# Patient Record
Sex: Female | Born: 1985 | Race: White | Hispanic: No | Marital: Single | Smoking: Current every day smoker
Health system: Southern US, Community
[De-identification: ages and names within clinical notes are randomized; demographics above are authoritative.]

## PROBLEM LIST (undated history)

## (undated) DIAGNOSIS — G40909 Epilepsy, unspecified, not intractable, without status epilepticus: Secondary | ICD-10-CM

## (undated) HISTORY — PX: APPENDECTOMY: SHX54

## (undated) HISTORY — PX: TUBAL LIGATION: SHX77

## (undated) HISTORY — PX: WISDOM TOOTH EXTRACTION: SHX21

---
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
## 2007-01-28 ENCOUNTER — Emergency Department: Payer: Self-pay | Admitting: Emergency Medicine

## 2007-07-21 ENCOUNTER — Emergency Department: Payer: Self-pay | Admitting: Emergency Medicine

## 2009-08-20 ENCOUNTER — Emergency Department: Payer: Self-pay | Admitting: Unknown Physician Specialty

## 2009-09-24 ENCOUNTER — Encounter: Payer: Self-pay | Admitting: Obstetrics & Gynecology

## 2010-02-08 ENCOUNTER — Observation Stay: Payer: Self-pay

## 2010-02-25 ENCOUNTER — Observation Stay: Payer: Self-pay

## 2010-03-10 ENCOUNTER — Inpatient Hospital Stay: Payer: Self-pay

## 2010-03-18 LAB — PATHOLOGY REPORT

## 2010-09-28 ENCOUNTER — Ambulatory Visit: Payer: Self-pay | Admitting: Internal Medicine

## 2011-01-11 ENCOUNTER — Inpatient Hospital Stay: Payer: Self-pay | Admitting: Obstetrics & Gynecology

## 2011-05-24 ENCOUNTER — Observation Stay: Payer: Self-pay | Admitting: Obstetrics and Gynecology

## 2011-07-13 ENCOUNTER — Observation Stay: Payer: Self-pay | Admitting: Obstetrics & Gynecology

## 2011-07-13 LAB — PIH PROFILE
Anion Gap: 13 (ref 7–16)
Calcium, Total: 8.1 mg/dL — ABNORMAL LOW (ref 8.5–10.1)
Chloride: 107 mmol/L (ref 98–107)
Co2: 21 mmol/L (ref 21–32)
Creatinine: 0.5 mg/dL — ABNORMAL LOW (ref 0.60–1.30)
EGFR (African American): >60
EGFR (Non-African Amer.): >60
Glucose: 84 mg/dL (ref 65–99)
HCT: 35.2 % (ref 35.0–47.0)
HGB: 11.5 g/dL — ABNORMAL LOW (ref 12.0–16.0)
MCH: 27.9 pg (ref 26.0–34.0)
MCV: 86 fL (ref 80–100)
Platelet: 227 10*3/uL (ref 150–440)
RBC: 4.11 10*6/uL (ref 3.80–5.20)
RDW: 14.6 % — ABNORMAL HIGH (ref 11.5–14.5)
SGOT(AST): 19 U/L (ref 15–37)
Uric Acid: 4.5 mg/dL (ref 2.6–6.0)
WBC: 11.3 10*3/uL — ABNORMAL HIGH (ref 3.6–11.0)

## 2011-07-13 LAB — PROTEIN / CREATININE RATIO, URINE
Creatinine, Urine: 17.6 mg/dL — ABNORMAL LOW (ref 30.0–125.0)
Protein, Random Urine: <5 mg/dL — ABNORMAL LOW (ref 0–12)

## 2011-07-15 ENCOUNTER — Inpatient Hospital Stay: Payer: Self-pay | Admitting: Obstetrics & Gynecology

## 2011-07-15 LAB — CBC WITH DIFFERENTIAL/PLATELET
Eosinophil #: 0 10*3/uL (ref 0.0–0.7)
Eosinophil %: 0.2 %
HGB: 11.6 g/dL — ABNORMAL LOW (ref 12.0–16.0)
Lymphocyte #: 3.3 10*3/uL (ref 1.0–3.6)
Lymphocyte %: 29.3 %
MCH: 28.5 pg (ref 26.0–34.0)
MCHC: 33.3 g/dL (ref 32.0–36.0)
MCV: 86 fL (ref 80–100)
Neutrophil #: 7.2 10*3/uL — ABNORMAL HIGH (ref 1.4–6.5)
Neutrophil %: 64.1 %
Platelet: 237 10*3/uL (ref 150–440)
RBC: 4.06 10*6/uL (ref 3.80–5.20)
RDW: 14.4 % (ref 11.5–14.5)
WBC: 11.2 10*3/uL — ABNORMAL HIGH (ref 3.6–11.0)

## 2011-07-15 LAB — PROTEIN / CREATININE RATIO, URINE
Creatinine, Urine: 184.3 mg/dL — ABNORMAL HIGH (ref 30.0–125.0)
Protein, Random Urine: 58 mg/dL — ABNORMAL HIGH (ref 0–12)
Protein/Creat. Ratio: 315 mg/gCREAT — ABNORMAL HIGH (ref 0–200)

## 2011-07-17 LAB — BETA STREP CULTURE(ARMC)

## 2011-08-25 ENCOUNTER — Ambulatory Visit: Payer: Self-pay | Admitting: Obstetrics and Gynecology

## 2011-08-25 LAB — CBC
HGB: 12.7 g/dL (ref 12.0–16.0)
MCHC: 33.3 g/dL (ref 32.0–36.0)
MCV: 86 fL (ref 80–100)
RBC: 4.43 10*6/uL (ref 3.80–5.20)
RDW: 14.3 % (ref 11.5–14.5)
WBC: 7.1 10*3/uL (ref 3.6–11.0)

## 2011-09-04 ENCOUNTER — Ambulatory Visit: Payer: Self-pay | Admitting: Obstetrics and Gynecology

## 2011-09-04 LAB — PREGNANCY, URINE: Pregnancy Test, Urine: NEGATIVE m[IU]/mL

## 2011-10-27 ENCOUNTER — Emergency Department: Payer: Self-pay | Admitting: Emergency Medicine

## 2012-02-24 ENCOUNTER — Inpatient Hospital Stay: Payer: Self-pay | Admitting: Internal Medicine

## 2012-02-24 LAB — CBC WITH DIFFERENTIAL/PLATELET
Basophil #: 0 10*3/uL (ref 0.0–0.1)
Basophil %: 0.3 %
Eosinophil #: 0.2 10*3/uL (ref 0.0–0.7)
Eosinophil %: 1.4 %
HGB: 12.8 g/dL (ref 12.0–16.0)
Lymphocyte #: 1.8 10*3/uL (ref 1.0–3.6)
Lymphocyte %: 11.7 %
MCH: 28.7 pg (ref 26.0–34.0)
MCHC: 33.8 g/dL (ref 32.0–36.0)
MCV: 85 fL (ref 80–100)
Monocyte #: 0.8 x10 3/mm (ref 0.2–0.9)
Monocyte %: 5.1 %
Neutrophil %: 81.5 %
Platelet: 273 10*3/uL (ref 150–440)
RBC: 4.45 10*6/uL (ref 3.80–5.20)

## 2012-02-24 LAB — COMPREHENSIVE METABOLIC PANEL
Albumin: 4 g/dL (ref 3.4–5.0)
Anion Gap: 8 (ref 7–16)
BUN: 5 mg/dL — ABNORMAL LOW (ref 7–18)
Calcium, Total: 8.5 mg/dL (ref 8.5–10.1)
Chloride: 104 mmol/L (ref 98–107)
EGFR (African American): >60
EGFR (Non-African Amer.): >60
Glucose: 90 mg/dL (ref 65–99)
Osmolality: 271 (ref 275–301)
Potassium: 3.1 mmol/L — ABNORMAL LOW (ref 3.5–5.1)
SGOT(AST): 33 U/L (ref 15–37)
SGPT (ALT): 32 U/L (ref 12–78)
Total Protein: 8.1 g/dL (ref 6.4–8.2)

## 2012-02-25 LAB — CBC WITH DIFFERENTIAL/PLATELET
Basophil %: 0.3 %
Eosinophil %: 1.5 %
HGB: 12.9 g/dL (ref 12.0–16.0)
Lymphocyte #: 2.3 10*3/uL (ref 1.0–3.6)
Lymphocyte %: 15.2 %
MCH: 27.8 pg (ref 26.0–34.0)
Monocyte #: 1 x10 3/mm — ABNORMAL HIGH (ref 0.2–0.9)
Neutrophil %: 76.3 %
RBC: 4.64 10*6/uL (ref 3.80–5.20)
RDW: 14.2 % (ref 11.5–14.5)

## 2012-02-25 LAB — VANCOMYCIN, TROUGH: Vancomycin, Trough: 10 ug/mL (ref 10–20)

## 2012-02-26 LAB — VANCOMYCIN, TROUGH: Vancomycin, Trough: 11 ug/mL (ref 10–20)

## 2012-02-26 LAB — CBC WITH DIFFERENTIAL/PLATELET
Basophil %: 0.4 %
Eosinophil %: 1.2 %
HCT: 38.7 % (ref 35.0–47.0)
Lymphocyte #: 1.9 10*3/uL (ref 1.0–3.6)
Lymphocyte %: 14.1 %
MCH: 27.3 pg (ref 26.0–34.0)
Monocyte #: 0.9 x10 3/mm (ref 0.2–0.9)
Monocyte %: 6.6 %
Neutrophil #: 10.7 10*3/uL — ABNORMAL HIGH (ref 1.4–6.5)
Neutrophil %: 77.7 %
RBC: 4.62 10*6/uL (ref 3.80–5.20)
WBC: 13.8 10*3/uL — ABNORMAL HIGH (ref 3.6–11.0)

## 2012-02-28 LAB — WOUND CULTURE

## 2012-02-29 LAB — CULTURE, BLOOD (SINGLE)

## 2012-10-18 ENCOUNTER — Emergency Department: Payer: Self-pay | Admitting: Unknown Physician Specialty

## 2013-02-03 ENCOUNTER — Emergency Department: Payer: Self-pay | Admitting: Emergency Medicine

## 2013-11-01 ENCOUNTER — Emergency Department: Payer: Self-pay | Admitting: Emergency Medicine

## 2013-12-30 IMAGING — CR RIGHT HAND - COMPLETE 3+ VIEW
1 series · 3 of 3 positions shown · non-contrast
Comparison: None.

CLINICAL DATA: Status post fall.  Pain about the right thumb.

EXAM:
RIGHT HAND - COMPLETE 3+ VIEW

[Series 1: pa · 0.17mm/px · 3 of 3 slices shown]
[im 1/3]
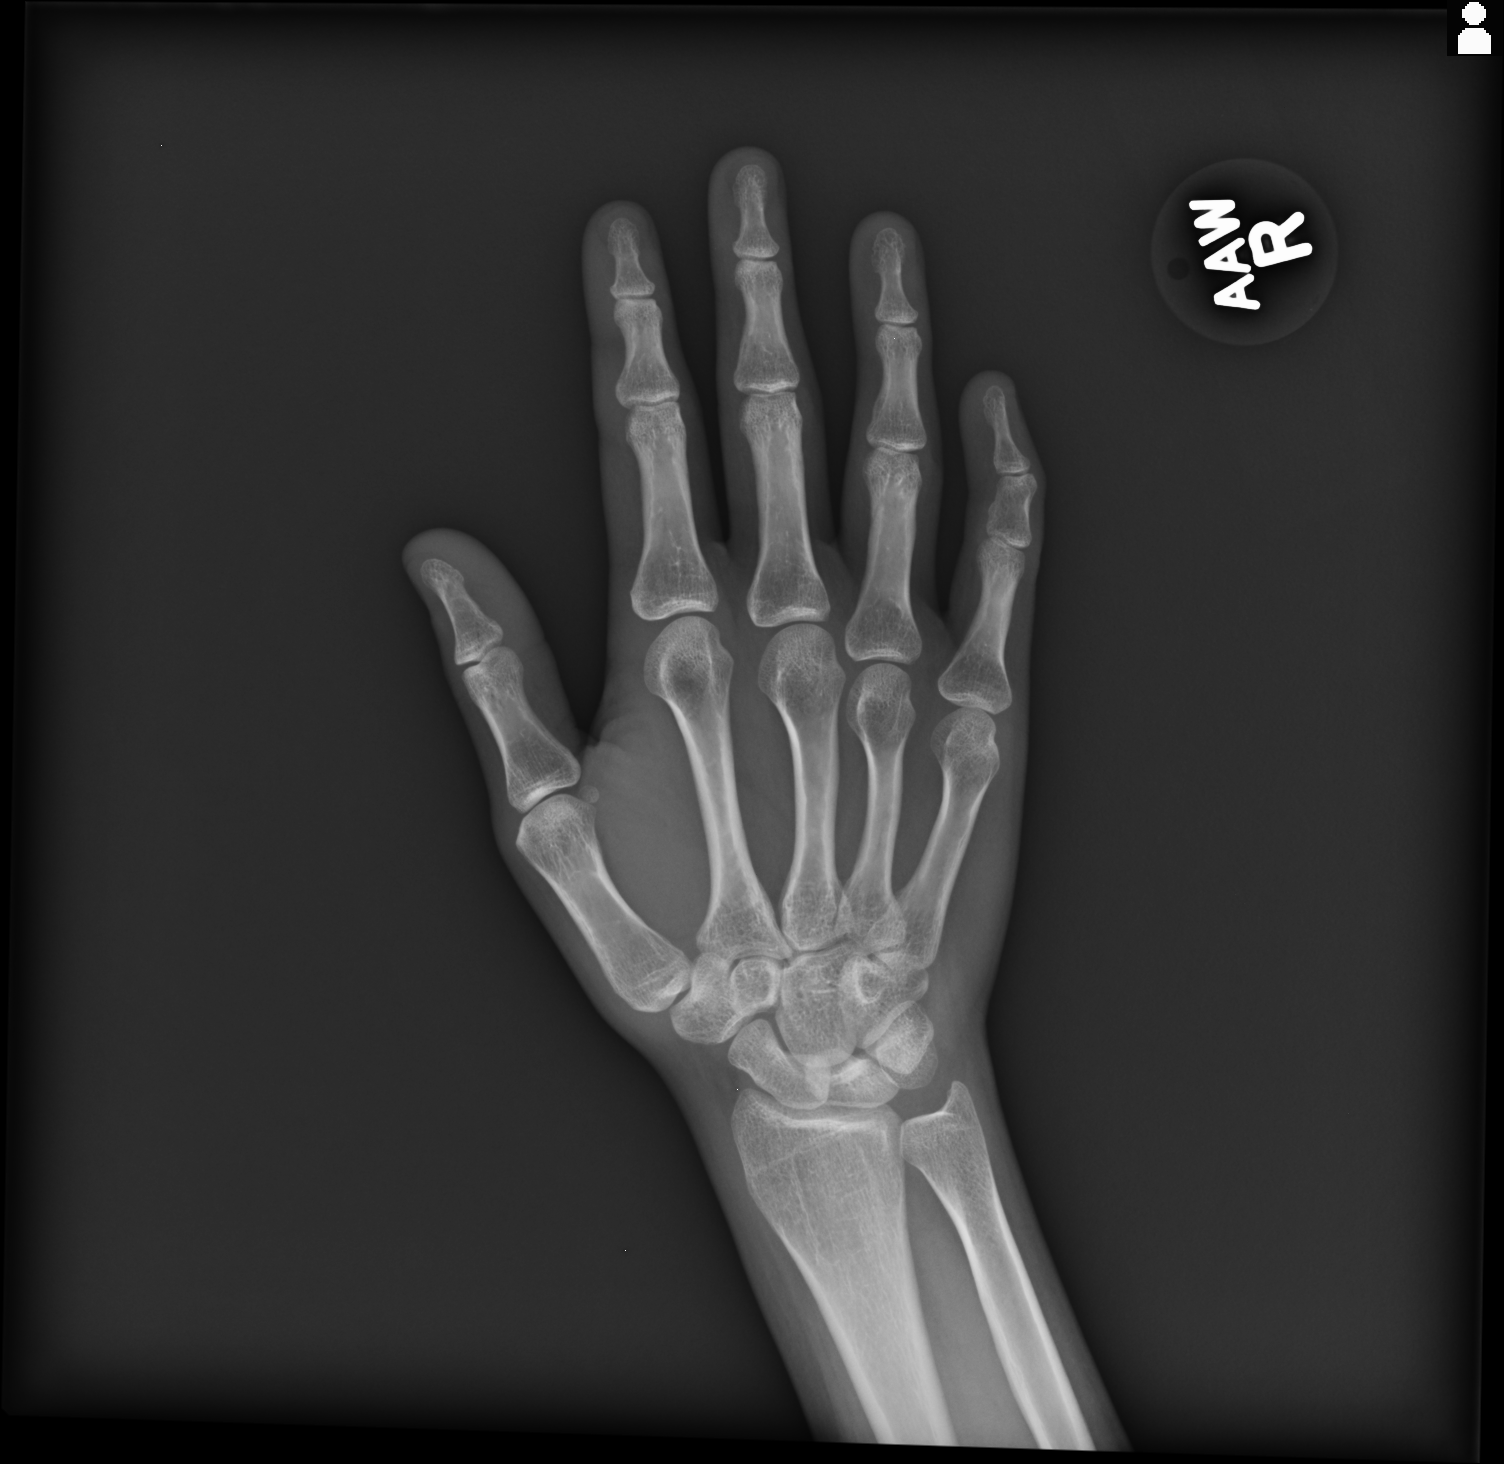
[im 2/3]
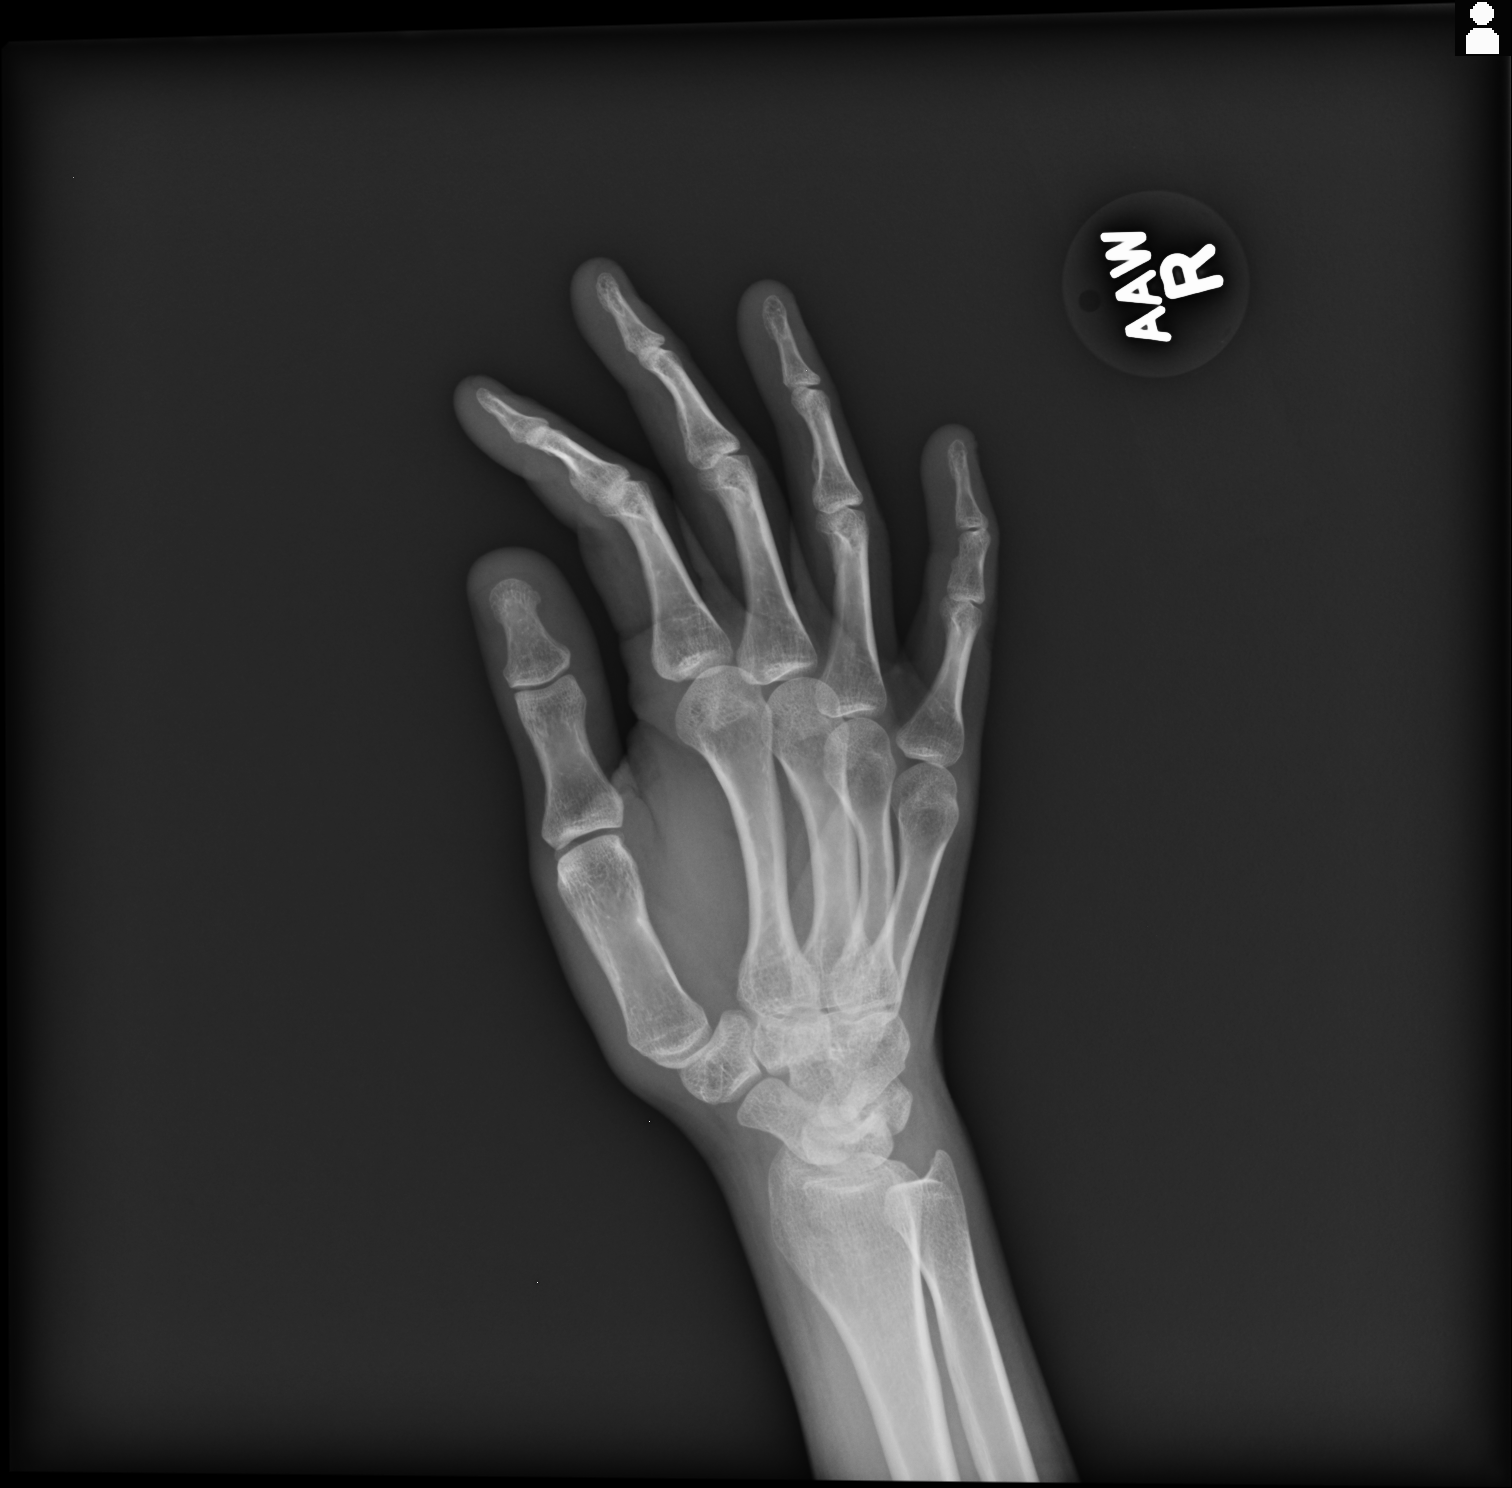
[im 3/3]
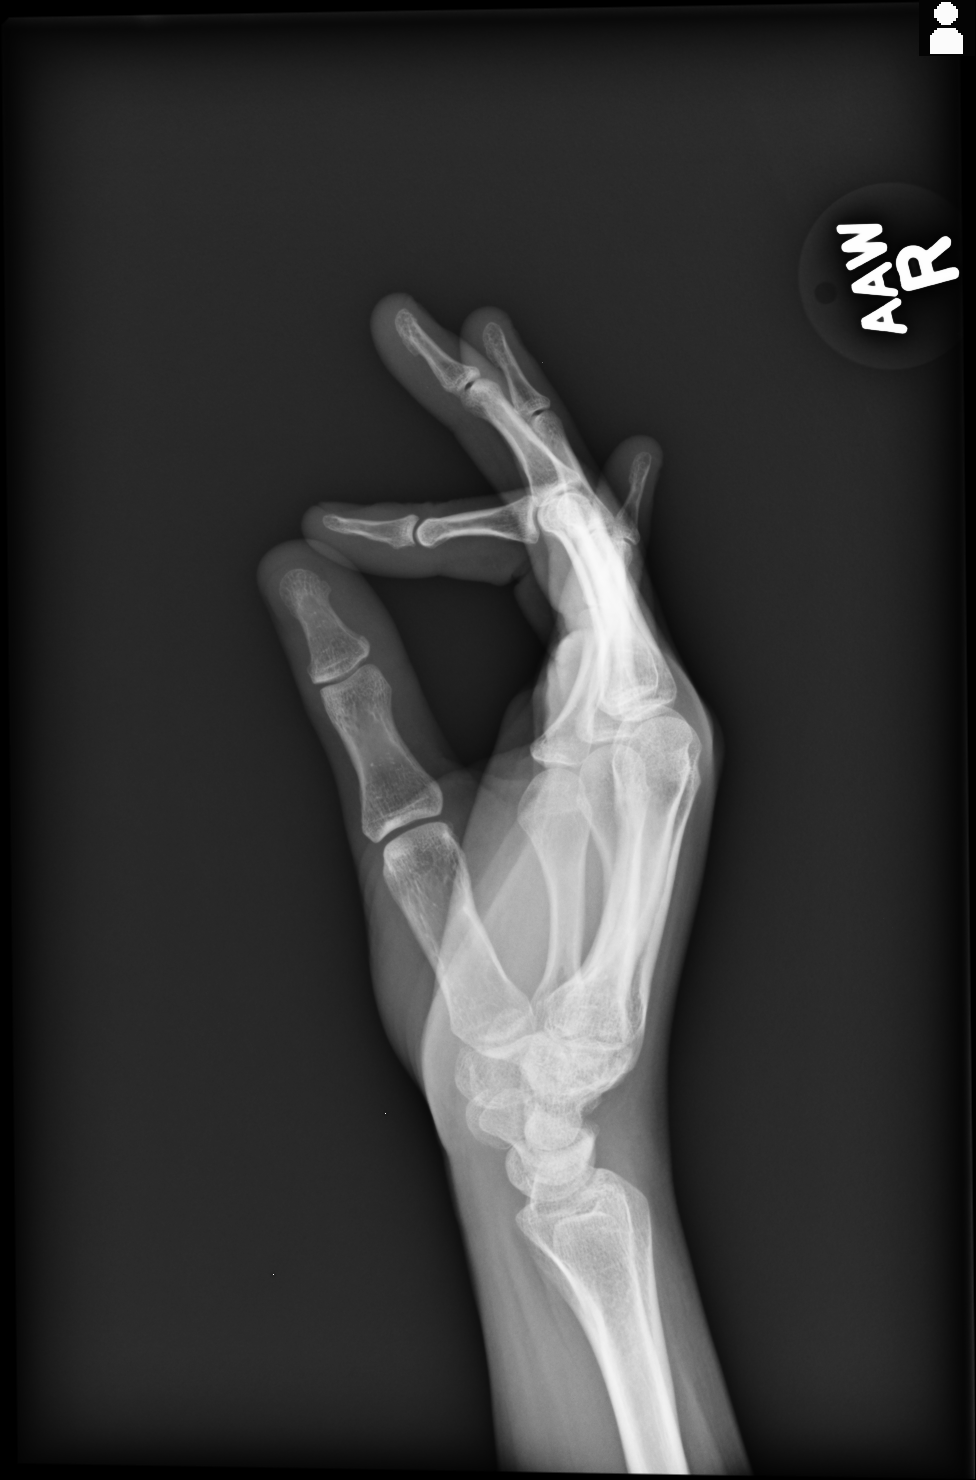

[3 of 3 positions shown; findings below may reference images not displayed]

FINDINGS: There is no evidence of fracture or dislocation. There is no
evidence of arthropathy or other focal bone abnormality. Soft
tissues are unremarkable.
IMPRESSION: Negative exam.

## 2014-01-11 ENCOUNTER — Emergency Department: Payer: Self-pay | Admitting: Emergency Medicine

## 2014-01-11 LAB — COMPREHENSIVE METABOLIC PANEL
ALT: 17 U/L
Albumin: 3.8 g/dL (ref 3.4–5.0)
Alkaline Phosphatase: 75 U/L
Anion Gap: 7 (ref 7–16)
BUN: 9 mg/dL (ref 7–18)
Bilirubin,Total: 0.4 mg/dL (ref 0.2–1.0)
CALCIUM: 8.3 mg/dL — AB (ref 8.5–10.1)
CREATININE: 0.8 mg/dL (ref 0.60–1.30)
Chloride: 107 mmol/L (ref 98–107)
Co2: 24 mmol/L (ref 21–32)
EGFR (African American): >60
EGFR (Non-African Amer.): >60
GLUCOSE: 89 mg/dL (ref 65–99)
Osmolality: 274 (ref 275–301)
Potassium: 3.9 mmol/L (ref 3.5–5.1)
SGOT(AST): 18 U/L (ref 15–37)
SODIUM: 138 mmol/L (ref 136–145)
TOTAL PROTEIN: 7.9 g/dL (ref 6.4–8.2)

## 2014-01-11 LAB — CBC WITH DIFFERENTIAL/PLATELET
BASOS PCT: 0.3 %
Basophil #: 0 10*3/uL (ref 0.0–0.1)
EOS ABS: 0.1 10*3/uL (ref 0.0–0.7)
EOS PCT: 0.8 %
HCT: 45.2 % (ref 35.0–47.0)
HGB: 15.3 g/dL (ref 12.0–16.0)
LYMPHS ABS: 1.7 10*3/uL (ref 1.0–3.6)
LYMPHS PCT: 10.6 %
MCH: 30.8 pg (ref 26.0–34.0)
MCHC: 33.9 g/dL (ref 32.0–36.0)
MCV: 91 fL (ref 80–100)
MONO ABS: 1 x10 3/mm — AB (ref 0.2–0.9)
Monocyte %: 6.4 %
Neutrophil #: 13.4 10*3/uL — ABNORMAL HIGH (ref 1.4–6.5)
Neutrophil %: 81.9 %
Platelet: 296 10*3/uL (ref 150–440)
RBC: 4.97 10*6/uL (ref 3.80–5.20)
RDW: 13 % (ref 11.5–14.5)
WBC: 16.3 10*3/uL — ABNORMAL HIGH (ref 3.6–11.0)

## 2014-01-11 LAB — URINALYSIS, COMPLETE
BILIRUBIN, UR: NEGATIVE
Bacteria: NONE SEEN
Blood: NEGATIVE
Glucose,UR: NEGATIVE mg/dL (ref 0–75)
KETONE: NEGATIVE
Nitrite: NEGATIVE
PROTEIN: NEGATIVE
Ph: 8 (ref 4.5–8.0)
RBC,UR: 3 /HPF (ref 0–5)
Specific Gravity: 1.013 (ref 1.003–1.030)
WBC UR: 4 /HPF (ref 0–5)

## 2014-01-11 LAB — LIPASE, BLOOD: Lipase: 113 U/L (ref 73–393)

## 2014-01-12 LAB — PREGNANCY, URINE: Pregnancy Test, Urine: NEGATIVE m[IU]/mL

## 2014-01-12 LAB — WET PREP, GENITAL

## 2014-01-12 LAB — GC/CHLAMYDIA PROBE AMP

## 2014-01-13 LAB — URINE CULTURE

## 2014-05-26 ENCOUNTER — Encounter (HOSPITAL_COMMUNITY): Payer: Self-pay | Admitting: *Deleted

## 2014-05-26 ENCOUNTER — Emergency Department (HOSPITAL_COMMUNITY)
Admission: EM | Admit: 2014-05-26 | Discharge: 2014-05-26 | Disposition: A | Discharge status: Home / Self Care | Payer: Medicaid Other | Attending: Emergency Medicine | Admitting: Emergency Medicine

## 2014-05-26 DIAGNOSIS — Z8669 Personal history of other diseases of the nervous system and sense organs: Secondary | ICD-10-CM | POA: Insufficient documentation

## 2014-05-26 DIAGNOSIS — Y999 Unspecified external cause status: Secondary | ICD-10-CM | POA: Insufficient documentation

## 2014-05-26 DIAGNOSIS — T148XXA Other injury of unspecified body region, initial encounter: Secondary | ICD-10-CM

## 2014-05-26 DIAGNOSIS — Y939 Activity, unspecified: Secondary | ICD-10-CM | POA: Insufficient documentation

## 2014-05-26 DIAGNOSIS — Z720 Tobacco use: Secondary | ICD-10-CM | POA: Insufficient documentation

## 2014-05-26 DIAGNOSIS — Y929 Unspecified place or not applicable: Secondary | ICD-10-CM | POA: Insufficient documentation

## 2014-05-26 DIAGNOSIS — S5011XA Contusion of right forearm, initial encounter: Secondary | ICD-10-CM | POA: Insufficient documentation

## 2014-05-26 DIAGNOSIS — W228XXA Striking against or struck by other objects, initial encounter: Secondary | ICD-10-CM | POA: Diagnosis not present

## 2014-05-26 HISTORY — DX: Epilepsy, unspecified, not intractable, without status epilepticus: G40.909

## 2014-05-26 NOTE — Discharge Instructions (Signed)

## 2014-05-26 NOTE — ED Notes (Signed)
Pt states she was in the ED 4 days ago and an IV was attempted 4 times in her rt arm. Pt now has bruising to forearm.

## 2014-05-26 NOTE — ED Provider Notes (Signed)
CSN: [id number]     Arrival date & time 05/26/14  2244 History   None    Chief Complaint  Patient presents with  . Bleeding/Bruising    The history is provided by the patient. No language interpreter was used.   This chart was scribed for non-physician practitioner Marlon Pel, PA-C, working with Glynn Octave, MD, by Andrew Au, ED Scribe. This patient was seen in room TR08C/TR08C and the patient's care was started at 11:36 PM.  Jillian James is a 29 y.o. female who presents to the Emergency Department complaining of bruising to right forearm. Pt reports she received an IV 4 days ago for dizziness  In [place] and states she was stuck with a needle multiple times in order to start IV by students. Pt now has a bruise to right anterior forearm that she states has gotten a gradually larger over time and has changed color. She reports minimal tendernes to bruise, no heat drainage, no elevation . Pt denies fever, weakness, nausea, vomiting, diarrhea, bleeding disorder or bruising in other locations.   Past Medical History  Diagnosis Date  . Epilepsy    Past Surgical History  Procedure Laterality Date  . Tubal ligation    . Wisdom tooth extraction    . Appendectomy     No family history on file. History  Substance Use Topics  . Smoking status: Current Every Day Smoker -- 1.00 packs/day    Types: Cigarettes  . Smokeless tobacco: Not on file  . Alcohol Use: No   OB History    No data available     Review of Systems  Hematological: Bruises/bleeds easily.  All other systems reviewed and are negative.  Allergies  Valium  Home Medications   Prior to Admission medications   Not on File   BP 158/95 mmHg  Pulse 85  Temp(Src) 98.5 F (36.9 C) (Oral)  Resp 18  Ht  (1.575 m)  Wt 121 lb (54.885 kg)  BMI 22.13 kg/m2  SpO2 99%  LMP 05/17/2014 Physical Exam  Constitutional: She is oriented to person, place, and time. She appears well-developed and  well-nourished. No distress.  HENT:  Head: Normocephalic and atraumatic.  Eyes: Conjunctivae and EOM are normal.  Neck: Neck supple.  Cardiovascular: Normal rate.   Pulmonary/Chest: Effort normal.  Musculoskeletal: Normal range of motion.       Arms: Neurological: She is alert and oriented to person, place, and time.  Skin: Skin is warm and dry.  Psychiatric: She has a normal mood and affect. Her behavior is normal.  Nursing note and vitals reviewed.   ED Course  Procedures (including critical care time) DIAGNOSTIC STUDIES: Oxygen Saturation is 99% on RA, normal by my interpretation.    COORDINATION OF CARE: 11:36 PM- Pt advised of plan for treatment and pt agrees.  Labs Review Labs Reviewed - No data to display  Imaging Review No results found.   EKG Interpretation None      MDM   Final diagnoses:  Bruising   Looked at patients forearm with [place] to r/o retained FB, although unlikely this was the case. No FB seen. Patient is having dependent edema from multiple needlestick wounds. Recommend warm compresses and reassurance. Pt given strict return to ED precautions. Swelling, new bruising, induration, redness or streaking, fevers, fatigue.  28 y.o.Eesha R Kittrell's evaluation in the Emergency Department is complete. It has been determined that no acute conditions requiring further emergency intervention are present at this time. The patient/guardian  have been advised of the diagnosis and plan. We have discussed signs and symptoms that warrant return to the ED, such as changes or worsening in symptoms.  Vital signs are stable at discharge. Filed Vitals:   05/26/14 2250  BP: 158/95  Pulse: 85  Temp: 98.5 F (36.9 C)  Resp: 18    Patient/guardian has voiced understanding and agreed to follow-up with the PCP or specialist.  I personally performed the services described in this documentation, which was scribed in my presence. The recorded information has been  reviewed and is accurate.   Marlon Peliffany Sheretha Shadd, PA-C 05/26/14 [phone]  Glynn OctaveStephen Rancour, MD 05/27/14 [phone]

## 2014-06-13 NOTE — H&P (Signed)
PATIENT NAME:  Jillian James, Jillian James MR#:  [id number] DATE OF BIRTH:  [birth year]  DATE OF ADMISSION:  02/24/2012  PRIMARY CARE PHYSICIAN: She has no primary care physician. She follows up with her OB/GYN.   REFERRING PHYSICIAN: Dr. Malachy Moanevainder Goli.  CHIEF COMPLAINT: Cellulitis of the left thigh.   HISTORY OF PRESENT ILLNESS: The patient is 29 year old Caucasian female with healthy past medical history apart from gestational hypertension. The patient tells me that her blood pressure is usually controlled in between the pregnancies and usually normalizes in the postpartum period. The patient came here with a two day history of development of a skin infection in the form of a small boil at the left thigh and also at the left forearm. However, on the left thigh it had expanded to form a large area of cellulitis. The patient reports a similar episode about two years ago and she was admitted and told that she had MRSA. The patient denies having any fever, no chills. Evaluation here was consistent with cellulitis of the left thigh. She was also found to be hypertensive. Her blood pressure reached 157/97. The patient tells me that usually her blood pressure is controlled and this is high for her.   REVIEW OF SYSTEMS: CONSTITUTIONAL: Denies having any fever. No chills. No fatigue.  EYES: No blurring of vision. No double vision.  ENT: No hearing impairment. No sore throat. No dysphagia.  CARDIOVASCULAR: No chest pain. No shortness of breath. No edema. No syncope.  RESPIRATORY: No cough. No sputum production. No chest pain.  GASTROINTESTINAL: No abdominal pain. No vomiting. No diarrhea.  GENITOURINARY: No dysuria. No frequency of urination.  MUSCULOSKELETAL: No joint pain or swelling. No muscular pain or swelling.  INTEGUMENTARY: Apart from the rash of cellulitis and small boil in the center of the lesion, a similar small boil of the left forearm, there is no other skin lesion, no ulcers.  NEUROLOGY: No  focal weakness. No seizure activity. No headache.  PSYCHIATRY: No anxiety. No depression.  ENDOCRINE: No polyuria or polydipsia. No heat or cold intolerance.  HEMATOLOGIC: No easy bruisability. No lymph node enlargement.   PAST MEDICAL HISTORY: Hypertension during the pregnancy only. History of seizure activity, but none for the last 40 years, and she is off any medication for that.   PAST SURGICAL HISTORY: Tubal ligation, appendectomy, and breast surgery for mastitis.   SOCIAL HABITS: Nonsmoker. No history of alcohol or drug abuse.   SOCIAL HISTORY: She is single, has two children. She lives with her mother.   FAMILY HISTORY: Negative for diabetes. Negative for cellulitis. Both parents are healthy.   ADMISSION MEDICATIONS: None.   ALLERGIES: VALIUM CAUSING ITCHING.  PHYSICAL EXAMINATION:  VITAL SIGNS: Blood pressure 157/97, respiratory rate 20, pulse 109, temperature 98.1, oxygen saturation 100%.  GENERAL APPEARANCE: A young female lying in bed in no acute distress.  HEAD AND NECK: No pallor, no icterus, no cyanosis. Ear examination revealed normal hearing, no discharge, no lesions. Nasal mucosa is normal. No lesions, no ulcers, no abscess formation. Oropharyngeal area was normal without any ulcers, no oral thrush. No exudates. Neck is supple. Trachea at midline. No thyromegaly, no cervical lymphadenopathy, no masses.  HEART: Normal S1, S2. No S3, S4. No murmur, no gallop, no carotid bruits.  RESPIRATORY: Normal breathing pattern without use of accessory muscles. No rales. No wheezing.  ABDOMEN: Soft without tenderness. No hepatosplenomegaly, no masses, no hernias.  SKIN: Large area of cellulitis at the left upper thigh area, maybe around 10 inches  in diameter with central lesion slightly raised and this in the form of very small boil. I did not feel any fluctuation or abscess formation so far. There is another satellite lesion outside the cellulitis area. It is a small raised area, but  did not form cellulitis around the periphery. A similar lesion on the left forearm close to the elbow, a small raised area that is red, but there is no surrounding redness so far. No ulcer formation.  MUSCULOSKELETAL: No joint swelling. No clubbing.  NEUROLOGIC: Cranial nerves II through XII are intact. No focal motor deficit.  PSYCHIATRIC: The patient is alert and oriented x 3. Mood and affect were normal.   LABORATORY FINDINGS: Nothing was done, no CBC or chemistry, which is fine. I would like to mention that she had a normal CBC in July, a few months ago with a white count of 7,000, hemoglobin 12, hematocrit 38, platelet count 365. She also had normal chemistry in May of this year. Anyway, I ordered 2 blood cultures.   ASSESSMENT:  1. Left thigh cellulitis.  2. Elevated blood pressure indicating possibility of underlying hypertension. The patient has history of gestational hypertension.   PLAN: Will admit the patient to the medical floor. Given her history of MRSA, will start vancomycin. Blood cultures were ordered. Will monitor the area of cellulitis. If there is fluctuation or swelling, then the patient will need incision and drainage. Regarding the elevated blood pressure, this will be monitored. If it is settled down, then it will need just follow up as an outpatient; however, if her hypertension is sustained, then will consider treatment.   Time spent in evaluating this patient took more than 45 minutes.     ____________________________ Carney Corners. Rudene Re, MD amd:es D: 02/24/2012 06:02:31 ET T: 02/24/2012 08:36:48 ET JOB#: [id number]  cc: Carney Corners. Rudene Re, MD, <Dictator> Zollie Scale MD ELECTRONICALLY SIGNED 02/24/2012 21:33

## 2014-06-16 NOTE — Consult Note (Signed)
PATIENT NAME:  Jillian James, Jillian James MR#:  [id number] DATE OF BIRTH:  05/10/1985  DATE OF CONSULTATION:  02/26/2012  REFERRING PHYSICIAN:  Medical Services. CONSULTING PHYSICIAN:  Zaira Iacovelli A. Egbert GaribaldiBird, MD  HISTORY OF PRESENT ILLNESS: This is a 29 year old white female with a history of MRSA admitted to the medical service on the 31st of last month with extensive less thigh cellulitis. She has a history of MRSA in the past. She was placed on intravenous antibiotics. Over the course of the last several days, the patient's cellulitis has improved. Surgery has been consulted.   ALLERGIES: VALIUM.   CURRENT MEDICATIONS: Include hydrochlorothiazide, potassium chloride, vancomycin, heparin subcutaneously, and Tylenol.   PAST MEDICAL HISTORY: Significant for gestational diabetes, history of MRSA.   PAST SURGICAL HISTORY: Appendectomy, breast surgery, and laparoscopic tubal ligation.   SOCIAL HISTORY: She does not smoke or drink. She is single, has 2 children, lives with her mother.   FAMILY HISTORY: Noncontributory.   REVIEW OF SYSTEMS: As described above and otherwise unremarkable.   PHYSICAL EXAMINATION:  VITAL SIGNS: Temperature is 97.9, pulse of 87, respiratory rate of 18, blood pressure is 117/88, pulse of 99.  GENERAL: She is a slightly disheveled white female in no obvious distress.  EXTREMITIES: Examination of her left antecubital fossa demonstrates a 5 mm subcutaneous area of induration with spontaneous purulent drainage. Examination of her left thigh demonstrates the Darah Simkin from her previous admission which is extensive. At the center of this is a carbuncle with induration and some intermittent purulent drainage. Distal to this is a much smaller area, similar in nature, but much smaller in size. There is an area of cellulitis surrounding the more larger area on the left thigh measuring 4 to 5 cm across. No obvious undrained purulence can be appreciated.   IMPRESSION: Left thigh infection, left  antecubital infection, likely methicillin-resistant Staphylococcus aureus, in no obvious need of drainage at this time.   RECOMMENDATIONS: Continue vancomycin. I will see the patient tomorrow to reassess for need for I and D.    ____________________________ Redge GainerMark A. Egbert GaribaldiBird, MD mab:es D: 02/27/2012 09:10:47 ET T: 02/27/2012 09:18:35 ET JOB#: [id number]  cc: Loraine LericheMark A. Egbert GaribaldiBird, MD, <Dictator> Feleshia Zundel A Artisha Capri MD ELECTRONICALLY SIGNED 03/01/2012 7:40

## 2014-06-16 NOTE — Discharge Summary (Signed)
PATIENT NAME:  Jillian James, Jillian James MR#:  [id number] DATE OF BIRTH:  03/11/1985  DATE OF ADMISSION:  02/24/2012 DATE OF DISCHARGE:  02/27/2012  DISCHARGE DIAGNOSES: 1. Left thigh cellulitis, left antecubital cellulitis.  2. Hypertension.   DISCHARGE MEDICATIONS:  1. HCTZ 25 mg p.o. daily.  2. Bactrim DS 800/160, 1 tablet p.o. b.i.d. for 10 days. 3. Micro-K mEq p.o. daily with HCTZ.   DIET: Low sodium diet.   CONSULTATIONS: Surgical consult with Dr. Egbert GaribaldiBird.   HOSPITAL COURSE: The patient is a 29 year old female with history of MRSA infections in the thigh, admitted because of cellulitis of the left thigh. The patient developed skin infection in the form of a boil in the left thigh and the left forearm which has expanded with redness, swelling and tenderness. The patient had history of MRSA before, so she was admitted to the hospitalist service, started on IV vancomycin. The patient is monitored closely. Her white count initially was elevated at 415, hemoglobin 12.8, hematocrit 37.8, platelets 273. Electrolytes were within normal limits except a low potassium of 3.1. Kidney function showed with a creatinine of 0.49. The patient's blood cultures have been negative. Wound cultures showed no growth in the left high. The patient's white count gradually decreased at 13.8. She has been afebrile. The patient was seen by Dr. Egbert GaribaldiBird, who said no indication for drainage at this time. The patient's blood pressure has been high, so blood pressure was around 143/97. The patient started on HCTZ. She has a history of gestational hypertension. Since starting HCTZ, her blood pressure improved nicely. It is 117/88 today and heart rate 87. The patient will be discharged with her HCTZ along with potassium. The patient can be discharged home with Bactrim. I advised to use warm compressions for the left thigh and follow up with her primary doctor The patient follows up with her OB/GYN. She can find a primary care physician in  the local area to follow up on her hypertension and also cellulitis, and she indicated that she  wanted to establish with Dr. Sheppard PentonWolf in [place], and I advised to follow up with him.   TIME SPENT ON DISCHARGE PREPARATION:  More than 30 minutes.   ____________________________ Katha HammingSnehalatha Keaten Mashek, MD sk:cb D: 02/27/2012 11:25:39 ET T: 02/27/2012 13:07:39 ET JOB#: [id number]  cc: Katha HammingSnehalatha Nely Dedmon, MD, <Dictator> Katha HammingSNEHALATHA Asley B[place] MD ELECTRONICALLY SIGNED 03/22/2012 9:06

## 2014-06-16 NOTE — Consult Note (Signed)
Brief Consult Note: Diagnosis: MRSA left thigh and left antecubital fossa cellulitis and abscess.   Patient was seen by consultant.   Recommend further assessment or treatment.   Comments: I see no present indication for surgical intervention, will follow.  Electronic Signatures: Natale LayBird, Ikram Riebe (MD)  (Signed 02-Jan-14 18:55)  Authored: Brief Consult Note   Last Updated: 02-Jan-14 18:55 by Natale LayBird, Kari Montero (MD)

## 2014-07-04 NOTE — H&P (Signed)
L&D Evaluation:  History Expanded:   HPI 29 yo G3P2 at term w contractions.  Hasnt received Prenatal Care since January.   No ROM, vb, headache, blurry vision, epigastric pain, edema.    Presents with contractions    Patient's Medical History No Chronic Illness    Patient's Surgical History none    Medications Pre Natal Vitamins    Allergies valium    Social History tobacco    Family History Non-Contributory   ROS:   ROS All systems were reviewed.  HEENT, CNS, GI, GU, Respiratory, CV, Renal and Musculoskeletal systems were found to be normal.   Exam:   Vital Signs stable  Initial BPs 140-150/90s, serial BPs 120-130/70-80.    Urine Protein negative dipstick    General no apparent distress    Mental Status clear    Chest clear    Heart normal sinus rhythm    Abdomen gravid, non-tender    Estimated Fetal Weight Average for gestational age    Back no CVAT    Edema no edema    Pelvic no external lesions, 3/70/ballottable    Mebranes Intact    Ucx irregular    Skin dry    Other NORMAL Pregnancy Induced Hypertension PROFILE   Impression:   Impression Abd pain.  No signs of labor.   Plan:   Plan Rest, fluids, get Prenatal Care.    Comments Sx's to monitor and return for discussed.   Electronic Signatures: Letitia LibraHarris, Wm Sahagun Paul (MD)  (Signed [phone]-May-13 20:55)  Authored: L&D Evaluation   Last Updated: 19-May-13 20:55 by Letitia LibraHarris, Murray Guzzetta Paul (MD)

## 2015-06-07 ENCOUNTER — Emergency Department
Admission: EM | Admit: 2015-06-07 | Discharge: 2015-06-07 | Disposition: A | Discharge status: Home / Self Care | Payer: Medicaid Other | Attending: Emergency Medicine | Admitting: Emergency Medicine

## 2015-06-07 ENCOUNTER — Emergency Department: Payer: Medicaid Other

## 2015-06-07 ENCOUNTER — Encounter: Payer: Self-pay | Admitting: *Deleted

## 2015-06-07 DIAGNOSIS — G40919 Epilepsy, unspecified, intractable, without status epilepticus: Secondary | ICD-10-CM | POA: Insufficient documentation

## 2015-06-07 DIAGNOSIS — G5602 Carpal tunnel syndrome, left upper limb: Secondary | ICD-10-CM

## 2015-06-07 DIAGNOSIS — Z9851 Tubal ligation status: Secondary | ICD-10-CM | POA: Diagnosis not present

## 2015-06-07 DIAGNOSIS — F17210 Nicotine dependence, cigarettes, uncomplicated: Secondary | ICD-10-CM | POA: Insufficient documentation

## 2015-06-07 DIAGNOSIS — M25532 Pain in left wrist: Secondary | ICD-10-CM | POA: Diagnosis present

## 2015-06-07 MED ORDER — PREDNISONE 20 MG PO TABS
60.0000 mg | ORAL_TABLET | Freq: Once | ORAL | Status: AC
Start: 2015-06-07 — End: 2015-06-07
  Administered 2015-06-07: 60 mg via ORAL
  Filled 2015-06-07: qty 3

## 2015-06-07 MED ORDER — PREDNISONE 20 MG PO TABS: 40.0000 mg | ORAL_TABLET | Freq: Every day | ORAL | Status: DC

## 2015-06-07 NOTE — Discharge Instructions (Signed)

## 2015-06-07 NOTE — ED Notes (Signed)
Pt has left wrist pain.  Pain for 1 month.  No known injury. No swelling noted.  Intermittent pain.

## 2015-06-07 NOTE — ED Provider Notes (Signed)
[facility] Emergency Department Provider Note  Time seen: 3:57 AM  I have reviewed the triage vital signs and the nursing notes.   HISTORY  Chief Complaint Wrist Pain    HPI Jillian James is a 30 y.o. female with a past medical history of epilepsy presents to the emergency department with left wrist pain times one month. According to the patient approximately 1 month ago she lifted her nephew which causes a shooting pain from her left wrist upper left forearm. She states since that time she has had intermittent pain in the left wrist shooting up the left arm with movement of the left wrist or lifting objects. Denies any trauma to the area. States she was having shooting pains tonight after lifting an object that she came to the emergency department for evaluation. Denies any swelling or redness. Denies fever or vomiting.     Past Medical History  Diagnosis Date  . Epilepsy (HCC)     There are no active problems to display for this patient.   Past Surgical History  Procedure Laterality Date  . Tubal ligation    . Wisdom tooth extraction    . Appendectomy      No current outpatient prescriptions on file.  Allergies Valium  No family history on file.  Social History Social History  Substance Use Topics  . Smoking status: Current Every Day Smoker -- 1.00 packs/day    Types: Cigarettes  . Smokeless tobacco: None  . Alcohol Use: No    Review of Systems Constitutional: Negative for fever. Cardiovascular: Negative for chest pain. Respiratory: Negative for shortness of breath. Gastrointestinal: Negative for abdominal pain Musculoskeletal: Left wrist pain Skin: Negative for rash. 10-point ROS otherwise negative.  ____________________________________________   PHYSICAL EXAM:  VITAL SIGNS: ED Triage Vitals  Enc Vitals Group     BP 06/07/15 0130 145/84 mmHg     Pulse Rate 06/07/15 0130 83     Resp 06/07/15 0130 18     Temp  06/07/15 0130 98.5 F (36.9 C)     Temp Source 06/07/15 0130 Oral     SpO2 06/07/15 0130 100 %     Weight 06/07/15 0130 125 lb (56.7 kg)     Height 06/07/15 0130 5\' 2"  (1.575 m)     Head Cir --      Peak Flow --      Pain Score 06/07/15 0132 5     Pain Loc --      Pain Edu? --      Excl. in GC? --     Constitutional: Alert and oriented. Well appearing and in no distress. Eyes: Normal exam ENT   Head: Normocephalic and atraumatic.   Mouth/Throat: Mucous membranes are moist. Cardiovascular: Normal rate, regular rhythm. No murmur Respiratory: Normal respiratory effort without tachypnea nor retractions. Breath sounds are clear Gastrointestinal: Soft and nontender. No distention.   Musculoskeletal: Patient has mild tenderness to palpation over the palmar aspect of the left wrist. Shooting pains elicited with tapping over the carpal tunnel. No erythema or swelling noted. Neurologic:  Normal speech and language. No gross focal neurologic deficit Skin:  Skin is warm, dry and intact.  Psychiatric: Mood and affect are normal. Speech and behavior are normal.   ____________________________________________     RADIOLOGY  X-ray negative for fracture   INITIAL IMPRESSION / ASSESSMENT AND PLAN / ED COURSE  Pertinent labs & imaging results that were available during my care of the patient were reviewed by  me and considered in my medical decision making (see chart for details).  Patient presents the emergency department 1 month of intermittent left wrist pain especially with heavy lifting. Patient has tenderness over this area especially with tapping on the carpal tunnel. Highly suspect carpal tunnel syndrome as the cause of her discomfort. We'll place the patient in a Velcro wrist splint, start the patient on a burst course of steroids and have the patient follow-up with orthopedics if not improved in one to 2 weeks. Patient is agreeable to this  plan.  ____________________________________________   FINAL CLINICAL IMPRESSION(S) / ED DIAGNOSES  Carpal tunnel syndrome   Minna Antis, MD 06/07/15 0400

## 2016-03-31 ENCOUNTER — Emergency Department: Payer: Medicaid Other

## 2016-03-31 ENCOUNTER — Emergency Department
Admission: EM | Admit: 2016-03-31 | Discharge: 2016-03-31 | Disposition: A | Discharge status: Home / Self Care | Payer: Medicaid Other | Attending: Emergency Medicine | Admitting: Emergency Medicine

## 2016-03-31 DIAGNOSIS — M546 Pain in thoracic spine: Secondary | ICD-10-CM | POA: Diagnosis not present

## 2016-03-31 DIAGNOSIS — Y999 Unspecified external cause status: Secondary | ICD-10-CM | POA: Insufficient documentation

## 2016-03-31 DIAGNOSIS — W108XXA Fall (on) (from) other stairs and steps, initial encounter: Secondary | ICD-10-CM | POA: Insufficient documentation

## 2016-03-31 DIAGNOSIS — F17210 Nicotine dependence, cigarettes, uncomplicated: Secondary | ICD-10-CM | POA: Diagnosis not present

## 2016-03-31 DIAGNOSIS — S299XXA Unspecified injury of thorax, initial encounter: Secondary | ICD-10-CM | POA: Diagnosis present

## 2016-03-31 DIAGNOSIS — Y929 Unspecified place or not applicable: Secondary | ICD-10-CM | POA: Diagnosis not present

## 2016-03-31 DIAGNOSIS — M7918 Myalgia, other site: Secondary | ICD-10-CM

## 2016-03-31 DIAGNOSIS — Y9389 Activity, other specified: Secondary | ICD-10-CM | POA: Insufficient documentation

## 2016-03-31 MED ORDER — OXYCODONE-ACETAMINOPHEN 5-325 MG PO TABS
1.0000 | ORAL_TABLET | Freq: Once | ORAL | Status: AC
  Administered 2016-03-31: 1 via ORAL
  Filled 2016-03-31: qty 1

## 2016-03-31 MED ORDER — LIDOCAINE 5 % EX PTCH
1.0000 | MEDICATED_PATCH | CUTANEOUS | Status: DC
  Administered 2016-03-31: 1 via TRANSDERMAL
  Filled 2016-03-31: qty 1

## 2016-03-31 MED ORDER — LIDOCAINE 5 % EX PTCH: 1.0000 | MEDICATED_PATCH | Freq: Two times a day (BID) | CUTANEOUS | 0 refills | Status: AC

## 2016-03-31 MED ORDER — CYCLOBENZAPRINE HCL 10 MG PO TABS: 10.0000 mg | ORAL_TABLET | Freq: Three times a day (TID) | ORAL | 0 refills | Status: DC | PRN

## 2016-03-31 MED ORDER — CYCLOBENZAPRINE HCL 10 MG PO TABS
10.0000 mg | ORAL_TABLET | Freq: Once | ORAL | Status: AC
  Administered 2016-03-31: 10 mg via ORAL
  Filled 2016-03-31: qty 1

## 2016-03-31 MED ORDER — ETODOLAC 200 MG PO CAPS: 200.0000 mg | ORAL_CAPSULE | Freq: Three times a day (TID) | ORAL | 0 refills | Status: DC

## 2016-03-31 NOTE — ED Provider Notes (Signed)
[facility] Emergency Department Provider Note   ____________________________________________   First MD Initiated Contact with Patient 03/31/16 [phone]     (approximate)  I have reviewed the triage vital signs and the nursing notes.   HISTORY  Chief Complaint Fall    HPI Jillian James is a 31 y.o. female who comes into the hospital today after taking a fall. The patient reports that her son had fallen asleep and she was taking him upstairs to bed when she lost her balance and fell. She reports that she fell on her back onto the steps. She hit the back of her head and reports that she blacked out for an instant and saw stars. She reports that her son's chin hit his zipper but she hit the back of her head. The patient reports that she has pain to her left shoulder blade as well as her mid back. She also has some pain in her neck. The patient reports that her pain is a 10 out of 10 in intensity. She didn't take anything for pain but came right in for evaluation. She has not had any vomiting does not have any dizziness is just hurting. She states that she did not fall down the stairs but just fell onto the steps.   Past Medical History:  Diagnosis Date  . Epilepsy (HCC)     There are no active problems to display for this patient.   Past Surgical History:  Procedure Laterality Date  . APPENDECTOMY    . TUBAL LIGATION    . WISDOM TOOTH EXTRACTION      Prior to Admission medications   Medication Sig Start Date End Date Taking? Authorizing Provider  cyclobenzaprine (FLEXERIL) 10 MG tablet Take 1 tablet (10 mg total) by mouth 3 (three) times daily as needed for muscle spasms. 03/31/16   Rebecka Apley, MD  etodolac (LODINE) 200 MG capsule Take 1 capsule (200 mg total) by mouth every 8 (eight) hours. 03/31/16   Rebecka Apley, MD  lidocaine (LIDODERM) 5 % Place 1 patch onto the skin every 12 (twelve) hours. Remove & Discard patch within 12 hours or as  directed by MD 03/31/16 03/31/17  Rebecka Apley, MD  predniSONE (DELTASONE) 20 MG tablet Take 2 tablets (40 mg total) by mouth daily. 06/07/15   Minna Antis, MD    Allergies Valium [diazepam]  No family history on file.  Social History Social History  Substance Use Topics  . Smoking status: Current Every Day Smoker    Packs/day: 1.00    Types: Cigarettes  . Smokeless tobacco: Not on file  . Alcohol use No    Review of Systems Constitutional: No fever/chills Eyes: No visual changes. ENT: No sore throat. Cardiovascular: Denies chest pain. Respiratory: Denies shortness of breath. Gastrointestinal: No abdominal pain.  No nausea, no vomiting.  No diarrhea.  No constipation. Genitourinary: Negative for dysuria. Musculoskeletal:  back pain, shoulder pain Skin: Negative for rash. Neurological: Dizziness  10-point ROS otherwise negative.  ____________________________________________   PHYSICAL EXAM:  VITAL SIGNS: ED Triage Vitals  Enc Vitals Group     BP 03/31/16 0049 (!) 138/92     Pulse Rate 03/31/16 0049 94     Resp 03/31/16 0049 (!) 22     Temp 03/31/16 0049 97.9 F (36.6 C)     Temp Source 03/31/16 0049 Oral     SpO2 03/31/16 0049 100 %     Weight 03/31/16 0049 125 lb (56.7 kg)  Height 03/31/16 0049 5\' 2"  (1.575 m)     Head Circumference --      Peak Flow --      Pain Score 03/31/16 0050 10     Pain Loc --      Pain Edu? --      Excl. in [place]? --     Constitutional: Alert and oriented. Well appearing and in Moderate distress. Eyes: Conjunctivae are normal. PERRL. EOMI. Head: Atraumatic. Nose: No congestion/rhinnorhea. Mouth/Throat: Mucous membranes are moist.  Oropharynx non-erythematous. Cardiovascular: Normal rate, regular rhythm. Grossly normal heart sounds.  Good peripheral circulation. Respiratory: Normal respiratory effort.  No retractions. Lungs CTAB. Gastrointestinal: Soft and nontender. No distention. Positive bowel sounds Musculoskeletal:   thoracic spine tenderness to palpation, cervical spine tenderness to palpation, left shoulder and scapula pain palpation  Neurologic:  Normal speech and language.  instability. Skin:  Skin is warm, dry and intact. Psychiatric: Mood and affect are normal.   ____________________________________________   LABS (all labs ordered are listed, but only abnormal results are displayed)  Labs Reviewed - No data to display ____________________________________________  EKG  none ____________________________________________  RADIOLOGY  CXR Cspine xray humerus xray ____________________________________________   PROCEDURES  Procedure(s) performed: None  Procedures  Critical Care performed: No  ____________________________________________   INITIAL IMPRESSION / ASSESSMENT AND PLAN / ED COURSE  Pertinent labs & imaging results that were available during my care of the patient were reviewed by me and considered in my medical decision making (see chart for details).  This is a 31 year old female who comes into the hospital today after a fall. The patient is having some pain to her back. I did give the patient a dose of Flexeril as well as some Percocet. I did check some x-rays and the x-rays were all negative. I feel that the pain is musculoskeletal. I will discharge the patient to home with some pain medicine and she should follow-up with her primary care physician.  Clinical Course as of Mar 31 318  Mon Mar 31, 2016  0148 No definite acute osseous abnormality DG Humerus Left [AW]  0148 No active cardiopulmonary disease. DG Chest 2 View [AW]  0247 Negative cervical spine radiographs. DG Cervical Spine Complete [AW]    Clinical Course User Index [AW] Rebecka ApleyAllison P Webster, MD     ____________________________________________   FINAL CLINICAL IMPRESSION(S) / ED DIAGNOSES  Final diagnoses:  Acute midline thoracic back pain  Musculoskeletal pain      NEW MEDICATIONS STARTED  DURING THIS VISIT:  New Prescriptions   CYCLOBENZAPRINE (FLEXERIL) 10 MG TABLET    Take 1 tablet (10 mg total) by mouth 3 (three) times daily as needed for muscle spasms.   ETODOLAC (LODINE) 200 MG CAPSULE    Take 1 capsule (200 mg total) by mouth every 8 (eight) hours.   LIDOCAINE (LIDODERM) 5 %    Place 1 patch onto the skin every 12 (twelve) hours. Remove & Discard patch within 12 hours or as directed by MD     Note:  This document was prepared using Dragon voice recognition software and may include unintentional dictation errors.    Rebecka ApleyAllison P Webster, MD 03/31/16 [phone]

## 2016-03-31 NOTE — ED Triage Notes (Signed)
Pt states that she was carrying her 29 year old son down the stairs, slipped and fell, pt states that she hit the back of her head on the wood steps and the upper part of her back, pt states that she saw stars and is having pain in her back and left side, pt states that it hurts to breathe and move her left arm

## 2016-03-31 NOTE — ED Notes (Signed)
Pt discharged to home.  Family member driving.  Discharge instructions reviewed.  Verbalized understanding.  No questions or concerns at this time.  Teach back verified.  Pt in NAD.  No items left in ED.   

## 2017-02-24 IMAGING — CR DG CHEST 2V
1 series · 2 of 2 positions shown · non-contrast
Comparison: 09/28/2010

CLINICAL DATA: Slipped and fell down the stairs, unable to raise
left arm

EXAM:
CHEST  2 VIEW

[Series 1: dg chest 2 view · 0.14mm/px · 2 of 2 slices shown]
[im 1/2]
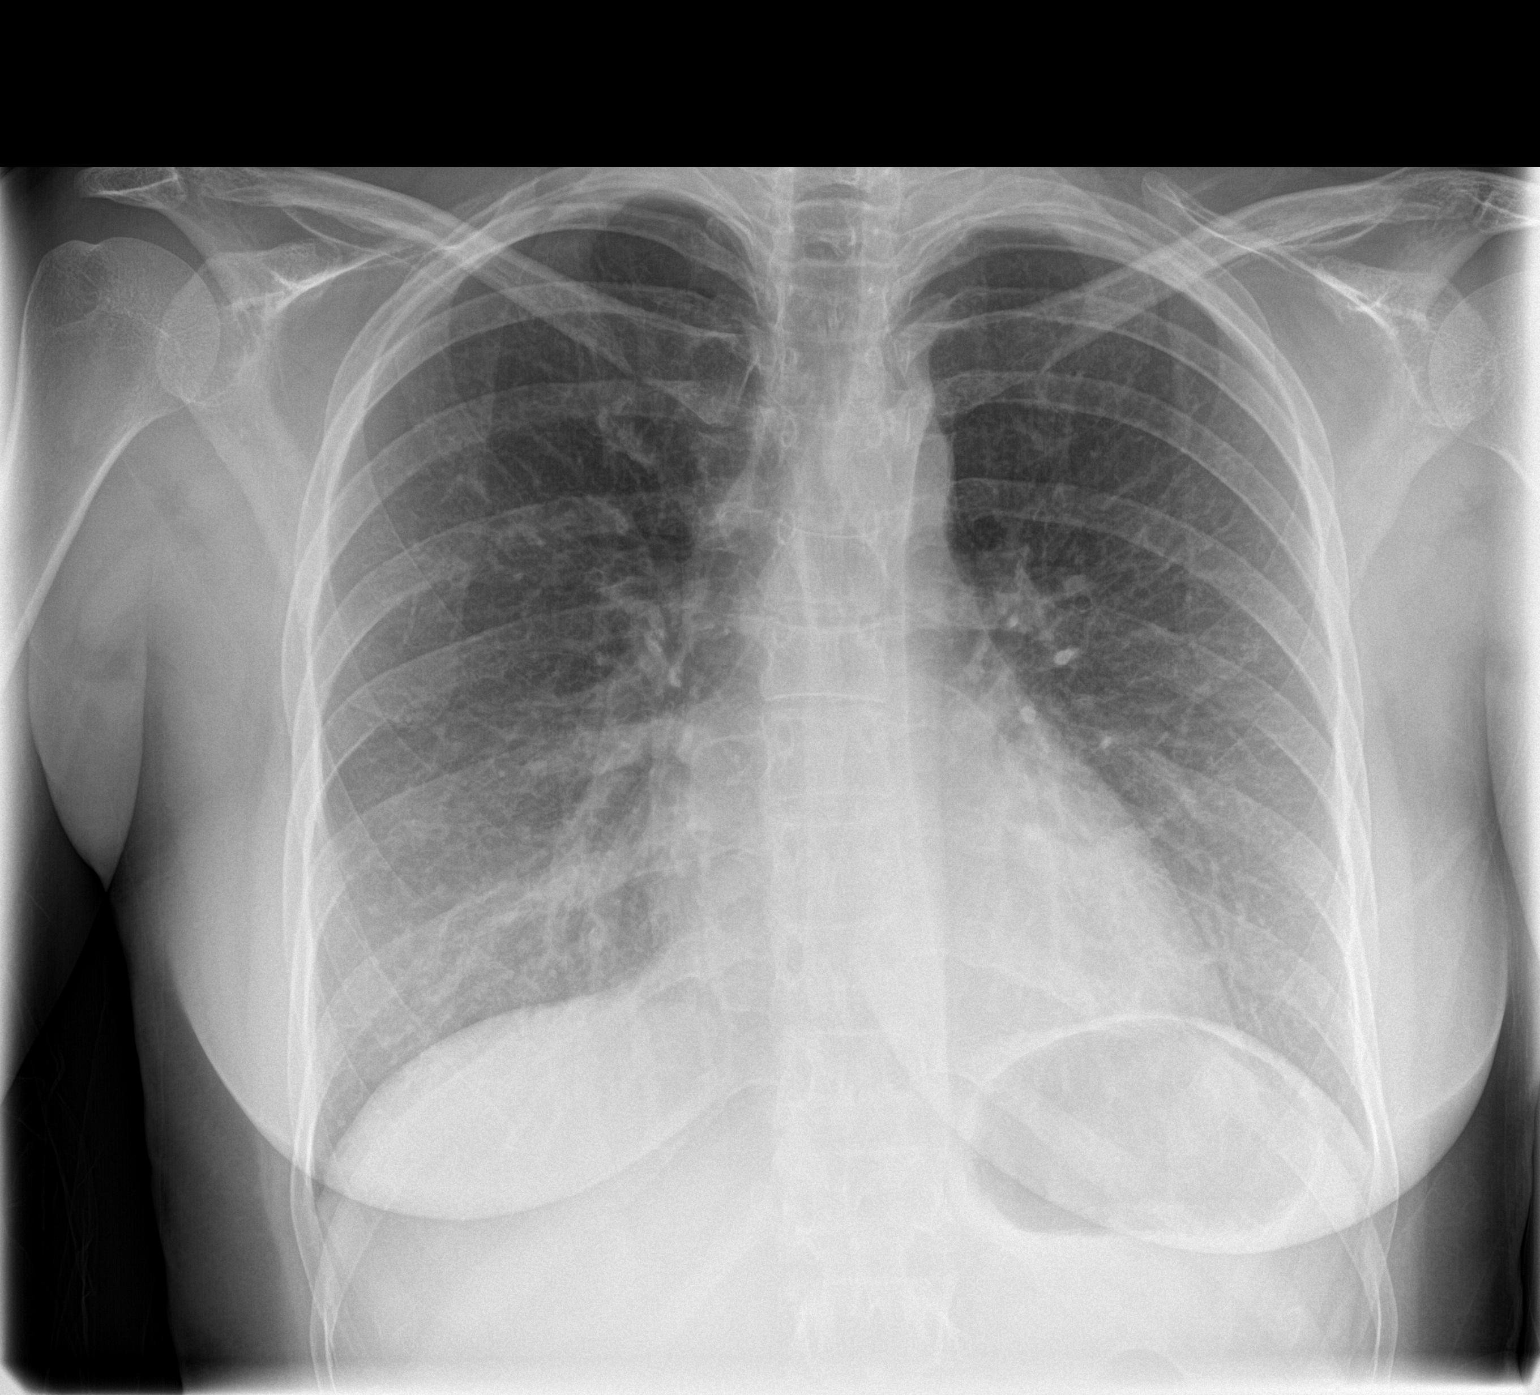
[im 2/2]
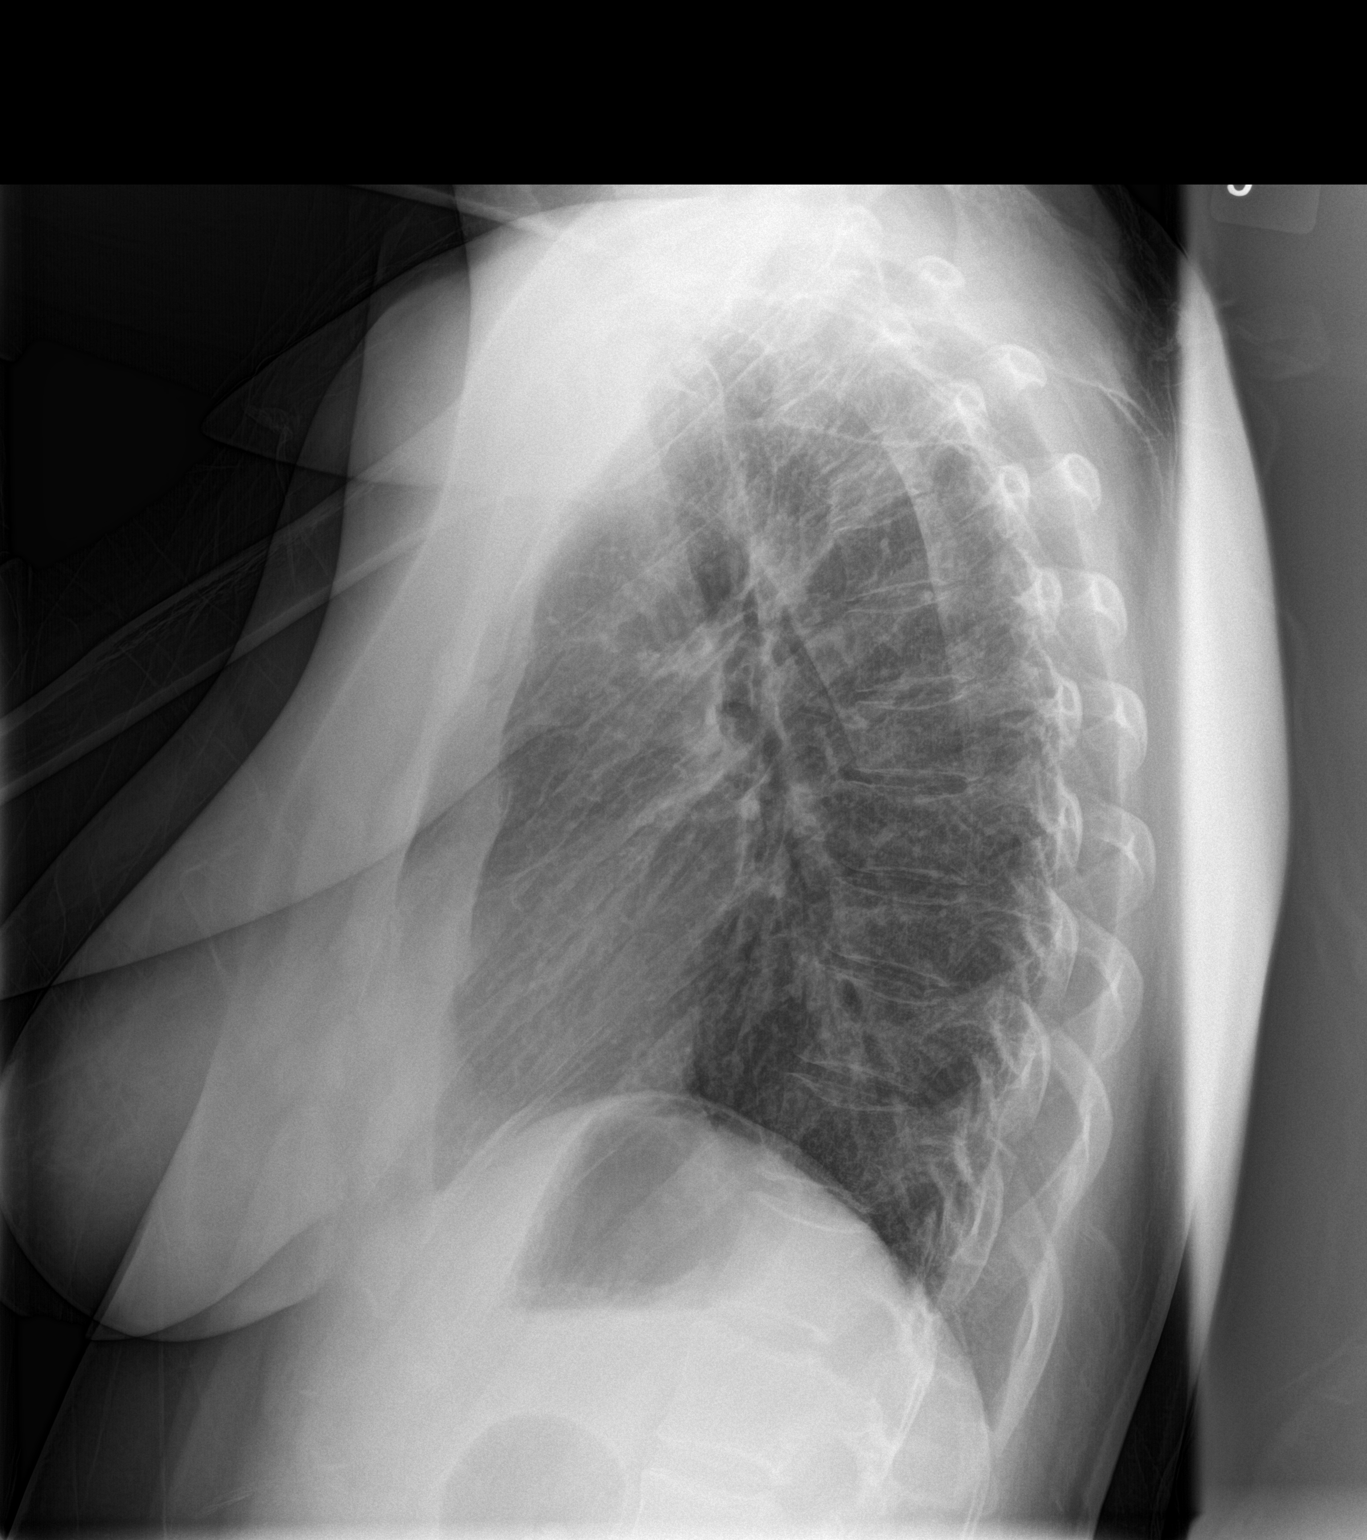

[2 of 2 positions shown; findings below may reference images not displayed]

FINDINGS: The heart size and mediastinal contours are within normal limits.
Both lungs are clear. The visualized skeletal structures are
unremarkable.
IMPRESSION: No active cardiopulmonary disease.

## 2021-12-13 ENCOUNTER — Ambulatory Visit: Payer: Medicaid Other | Admitting: Nurse Practitioner

## 2021-12-28 ENCOUNTER — Emergency Department
Admission: EM | Admit: 2021-12-28 | Discharge: 2021-12-28 | Disposition: A | Discharge status: Home / Self Care | Payer: Medicaid Other | Attending: Emergency Medicine | Admitting: Emergency Medicine

## 2021-12-28 ENCOUNTER — Emergency Department: Payer: Medicaid Other

## 2021-12-28 ENCOUNTER — Other Ambulatory Visit: Payer: Self-pay

## 2021-12-28 DIAGNOSIS — F17210 Nicotine dependence, cigarettes, uncomplicated: Secondary | ICD-10-CM | POA: Insufficient documentation

## 2021-12-28 DIAGNOSIS — W108XXA Fall (on) (from) other stairs and steps, initial encounter: Secondary | ICD-10-CM | POA: Diagnosis not present

## 2021-12-28 DIAGNOSIS — S59902A Unspecified injury of left elbow, initial encounter: Secondary | ICD-10-CM | POA: Insufficient documentation

## 2021-12-28 NOTE — ED Provider Notes (Signed)
[facility] Emergency Department Provider Note   ____________________________________________   Event Date/Time   First MD Initiated Contact with Patient 12/28/21 1814     (approximate)  I have reviewed the triage vital signs and the nursing notes.   HISTORY  Chief Complaint Fall (Pain L elbow. No obvious injury.fell down 3 steps.)    HPI Jillian James is a 36 y.o. female presents to the emergency room after having a fall earlier this morning.  She reports that she fell down approximately 3 steps and landed on her left elbow.  Patient has previously broken this elbow twice before.  Patient reports that since the fall she has had significant pain that she would rate a 6 out of 10 in the elbow.  Patient describes the pain as a dull aching sensation.  Patient denies any other injuries from the fall.  Patient has not taken any medication for the pain at this time.   Past Medical History:  Diagnosis Date   Epilepsy ([place])     There are no problems to display for this patient.   Past Surgical History:  Procedure Laterality Date   APPENDECTOMY     TUBAL LIGATION     WISDOM TOOTH EXTRACTION      Prior to Admission medications   Medication Sig Start Date End Date Taking? Authorizing Provider  cyclobenzaprine (FLEXERIL) 10 MG tablet Take 1 tablet (10 mg total) by mouth 3 (three) times daily as needed for muscle spasms. 03/31/16   Loney Hering, MD  etodolac (LODINE) 200 MG capsule Take 1 capsule (200 mg total) by mouth every 8 (eight) hours. 03/31/16   Loney Hering, MD  predniSONE (DELTASONE) 20 MG tablet Take 2 tablets (40 mg total) by mouth daily. 06/07/15   Harvest Dark, MD    Allergies Valium [diazepam]  No family history on file.  Social History Social History   Tobacco Use   Smoking status: Every Day    Packs/day: 1.00    Types: Cigarettes  Substance Use Topics   Alcohol use: No   Drug use: No    Review of  Systems  Constitutional: No fever/chills Eyes: No visual changes. ENT: No sore throat. Cardiovascular: Denies chest pain. Respiratory: Denies shortness of breath. Gastrointestinal: No abdominal pain.  No nausea, no vomiting.  No diarrhea.  No constipation. Genitourinary: Negative for dysuria. Musculoskeletal: Positive for left elbow pain Skin: Negative for rash. Neurological: Negative for headaches, focal weakness or numbness.   ____________________________________________   PHYSICAL EXAM:  VITAL SIGNS: ED Triage Vitals  Enc Vitals Group     BP 12/28/21 1747 (!) 191/105     Pulse Rate 12/28/21 1747 100     Resp 12/28/21 1747 20     Temp 12/28/21 1747 97.9 F (36.6 C)     Temp Source 12/28/21 1747 Oral     SpO2 12/28/21 1747 98 %     Weight 12/28/21 1748 130 lb (59 kg)     Height 12/28/21 1748 5\' 2"  (1.575 m)     Head Circumference --      Peak Flow --      Pain Score 12/28/21 1747 7     Pain Loc --      Pain Edu? --      Excl. in [place]? --     Constitutional: Alert and oriented. Well appearing and in no acute distress. Eyes: Conjunctivae are normal. PERRL. EOMI. Head: Atraumatic. Nose: No congestion/rhinnorhea. Mouth/Throat: Mucous membranes are moist.  Oropharynx non-erythematous. Neck: No stridor.   Cardiovascular: Normal rate, regular rhythm. Grossly normal heart sounds.  Good peripheral circulation. Respiratory: Normal respiratory effort.  No retractions. Lungs CTAB. Gastrointestinal: Soft and nontender. No distention. No abdominal bruits. No CVA tenderness. Musculoskeletal: Patient complains of left elbow pain.  There is no obvious bony abnormality noted.  There is no redness/warmth/bruising to the elbow.  Patient has full range of motion without difficulty.  Patient is unable to fully extend arm.  However, this is her normal.  Patient does have significant amount of tenderness to palpation over the medial/posterior aspect of elbow. Neurologic:  Normal speech and  language. No gross focal neurologic deficits are appreciated. No gait instability. Skin:  Skin is warm, dry and intact. No rash noted. Psychiatric: Mood and affect are normal. Speech and behavior are normal.  ____________________________________________   LABS (all labs ordered are listed, but only abnormal results are displayed)  Labs Reviewed - No data to display ____________________________________________  EKG   ____________________________________________  RADIOLOGY  ED MD interpretation: X-ray of left elbow was reviewed by me and read by radiologist.  Official radiology report(s): DG Elbow Complete Left  Result Date: 12/28/2021 CLINICAL DATA:  Left elbow pain following a fall. EXAM: LEFT ELBOW - COMPLETE 3+ VIEW COMPARISON:  Left humerus dated 03/31/2016. FINDINGS: There is no evidence of fracture, dislocation, or joint effusion. There is no evidence of arthropathy or other focal bone abnormality. Soft tissues are unremarkable. IMPRESSION: Negative. Electronically Signed   By: Beckie Salts M.D.   On: 12/28/2021 19:11    ____________________________________________   PROCEDURES  Procedure(s) performed: None  Procedures  Critical Care performed: No  ____________________________________________   INITIAL IMPRESSION / ASSESSMENT AND PLAN / ED COURSE     Jillian James is a 36 y.o. female presents to the emergency room after having a fall earlier this morning.  She reports that she fell down approximately 3 steps and landed on her left elbow.  Patient has previously broken this elbow twice before.  Patient reports that since the fall she has had significant pain that she would rate a 6 out of 10 in the elbow.  Patient describes the pain as a dull aching sensation.  Patient denies any other injuries from the fall.  Patient has not taken any medication for the pain at this time.   X-ray of left elbow is ordered. X-ray reveals no fracture.  X-ray results were  reviewed with patient.  I discussed with her that she should take Tylenol or ibuprofen for the pain.  I have also discussed with her that she should ice the area for the next 24 to 48 hours.  She should then follow-up with [facility] urgent care if she continues with pain after 3 to 5 days.  Patient will be discharged home in stable condition at this time.      ____________________________________________   FINAL CLINICAL IMPRESSION(S) / ED DIAGNOSES  Final diagnoses:  Injury of left elbow, initial encounter     ED Discharge Orders     None        Note:  This document was prepared using Dragon voice recognition software and may include unintentional dictation errors.     Herschell Dimes, NP 12/28/21 2023    Jene Every, MD 12/30/21 0700

## 2021-12-28 NOTE — ED Triage Notes (Signed)
Pt arrived by pov. Pt fell down 3 steps, and hurt her L arm. Specifically her elbow. Also pt stated that "pins and kneedles" in her fingers. Pt ambulatory. NAD.

## 2021-12-28 NOTE — Discharge Instructions (Signed)
You have been seen today in the emergency room for an injury to your elbow.  Your x-rays were negative for fracture.  You can take Tylenol or ibuprofen for the pain.  I have also discussed with you that you should ice the area for the next 24 to 48 hours.  If you continue to have pain after 3 to 5 days please follow-up with [facility] clinic urgent care for possible reimaging.

## 2022-02-12 DIAGNOSIS — I10 Essential (primary) hypertension: Secondary | ICD-10-CM | POA: Insufficient documentation

## 2022-03-17 DIAGNOSIS — F411 Generalized anxiety disorder: Secondary | ICD-10-CM | POA: Insufficient documentation

## 2023-03-04 NOTE — Progress Notes (Signed)
 Subjective:     Patient ID: Jillian James is a 38 y.o.  Cough This is a new problem. The current episode started more than 1 month ago. The problem has been gradually worsening. Associated symptoms include headaches, nasal congestion and shortness of breath. Pertinent negatives include no sore throat.  URI  Associated symptoms include coughing and headaches. Pertinent negatives include no sore throat.    The following portions of the patient's history were reviewed and updated as appropriate.  Past Medical History:  has a past medical history of Hypertension and Seizures (CMS/HHS-HCC).  Problem List: has Hypertension on their problem list.  Past Surgical History:  has a past surgical history that includes Appendectomy and Tubal ligation.  Family History: family history includes Anxiety in her mother.  Social History:  reports that she has been smoking cigarettes. She has a 1.5 pack-year smoking history. She has never used smokeless tobacco. She reports that she does not drink alcohol and does not use drugs.  Current Medications: has a current medication list which includes the following prescription(s): lisinopril, zolpidem, albuterol mdi (proventil, ventolin, proair) hfa, azithromycin, cyclobenzaprine , oseltamivir, prednisone , and promethazine-dextromethorphan.  Prior to encounter Medications:  Current Outpatient Medications on File Prior to Visit  Medication Sig Dispense Refill  . lisinopriL (ZESTRIL) 10 MG tablet Take 10 mg by mouth once daily for Blood Pressure    . zolpidem (AMBIEN) 5 MG tablet Take 5 mg by mouth at bedtime as needed for Sleep    . cyclobenzaprine  (FLEXERIL ) 10 MG tablet TAKE 1/2 TABLET BY MOUTH 3 TIMES A DAY AS NEEDED MUSCLE SPASMS (Patient not taking: Reported on 03/04/2023)  0   No current facility-administered medications on file prior to visit.    Allergies: is allergic to valium [diazepam].  Review of Systems  HENT:  Negative for sore throat.    Respiratory:  Positive for cough and shortness of breath.   Neurological:  Positive for headaches.       Objective:   Physical Exam  Results for orders placed or performed in visit on 03/04/23  Extended Respiratory Viral Panel - [facility]   Specimen: Nasal Swab; Other  Result Value Ref Range   Influenza A PCR Positive (!) Negative   Influenza B PCR Negative Negative   RSV PCR Negative Negative   SARS-CoV2 PCR Negative Negative   Narrative   Positive  Positive results are indicative of the presence of the identified virus, but do not rule out bacterial infection or co-infection with other pathogens not detected by the test. Clinical correlation with patient history and other diagnostic information is necessary to determine patient infection status.  Negative  Negative results do not preclude SARS-CoV-2 infection, Influenza A virus, Influenza B virus and/or RSV infection and should not be used as the sole basis for treatment or other patient management decisions. Negative results must be combined with clinical observations, patient history, and/or epidemiological information.  Test Comments:  This test has not been FDA cleared or approved. This test has been authorized by FDA under an Emergency Use Authorization (EUA). This test is only authorized for the duration of the declaration that circumstances exist justifying the authorization of emergency use of in vitro diagnostics for detection and/or diagnosis of COVID-19 under Section 564(b) (1) of the Act, 21 U.S.C. 360bbb-3 (b) (1), unless the authorization is terminated or revoked sooner.    ED Imaging Orders (From admission, onward)    Start     Status Ordering Provider   03/04/23 0000  X-ray  chest PA and lateral        Rosa BOHR, KELLY GARDNER     Reviewed with Dr. Delaine. +/- markings.    Assessment:     1. Influenza A -     oseltamivir (TAMIFLU) 75 MG capsule; Take 1 capsule (75 mg total) by mouth 2 (two) times  daily for 5 days  Dispense: 10 capsule; Refill: 0  2. Encntr for obs for susp expsr to oth biolg agents ruled out -     Extended Respiratory Viral Panel - [facility]  3. Subacute cough -     X-ray chest PA and lateral -     azithromycin (ZITHROMAX) 250 MG tablet; 2 tabs on day one, then 1 tab daily x 4 days  Dispense: 6 tablet; Refill: 0 -     predniSONE  (DELTASONE ) 20 MG tablet; Take 1 tablet (20 mg total) by mouth 2 (two) times daily for 5 days  Dispense: 10 tablet; Refill: 0 -     albuterol MDI, PROVENTIL, VENTOLIN, PROAIR, HFA 90 mcg/actuation inhaler; Inhale 2 inhalations into the lungs every 4 (four) hours as needed for Wheezing or Shortness of Breath  Dispense: 8 g; Refill: 0 -     promethazine-dextromethorphan (PROMETHAZINE-DM) 6.25-15 mg/5 mL syrup; Take 5 mLs by mouth every 6 (six) hours as needed for Cough  Dispense: 120 mL; Refill: 0       Plan:     Patient Instructions  You are being treated for influenza. You are being prescribed an antiviral. For you cough, you are being prescribed antibiotics, an inhaler, prednisone  and a cough medication. Please take as directed. Warm compresses, nasal saline washes, steam showers, warm salt water gargles for further symptomatic relief. OTC Flonase nasal spray for congestion. Tylenol  per package instructions for pain relief. Follow up in clinic if symptoms persist despite treatment. Go to ED if develop chest pain, shortness of breath, sudden/severe headache, neurologic symptoms, changes in vision, neck stiffness.

## 2023-11-27 DIAGNOSIS — F17200 Nicotine dependence, unspecified, uncomplicated: Secondary | ICD-10-CM | POA: Insufficient documentation

## 2023-11-27 DIAGNOSIS — R1030 Lower abdominal pain, unspecified: Secondary | ICD-10-CM | POA: Insufficient documentation

## 2024-01-01 NOTE — Patient Instructions (Signed)
 Abdominal Pain, Adult    Many things can cause belly (abdominal) pain. In most cases, belly pain is not a serious problem and can be watched and treated at home. But in some cases, it can be serious.  Your doctor will try to find the cause of your belly pain.  Follow these instructions at home:  Medicines  Take over-the-counter and prescription medicines only as told by your doctor.  Do not take medicines that help you poop (laxatives) unless told by your doctor.  General instructions  Watch your belly pain for any changes. Tell your doctor if the pain gets worse.  Drink enough fluid to keep your pee (urine) pale yellow.  Contact a doctor if:  Your belly pain changes or gets worse.  You have very bad cramping or bloating in your belly.  You vomit.  Your pain gets worse with meals, after eating, or with certain foods.  You have trouble pooping or have watery poop for more than 2-3 days.  You are not hungry, or you lose weight without trying.  You have signs of not getting enough fluid or water (dehydration). These may include:  Dark pee, very Chastain pee, or no pee.  Cracked lips or dry mouth.  Feeling sleepy or weak.  You have pain when you pee or poop.  Your belly pain wakes you up at night.  You have blood in your pee.  You have a fever.  Get help right away if:  You cannot stop vomiting.  Your pain is only in one part of your belly, like on the right side.  You have bloody or black poop, or poop that looks like tar.  You have trouble breathing.  You have chest pain.  These symptoms may be an emergency. Get help right away. Call 911.  Do not wait to see if the symptoms will go away.  Do not drive yourself to the hospital.  This information is not intended to replace advice given to you by your health care provider. Make sure you discuss any questions you have with your health care provider.  Document Revised: 11/27/2021 Document Reviewed: 11/27/2021  Elsevier Patient Education  2024 [employer].

## 2024-01-01 NOTE — Progress Notes (Signed)
 Patient, No Pcp Per   Chief Complaint  Patient presents with   Abdominal Pain    HPI:      Jillian James is a 38 y.o. No obstetric history on file. whose LMP was Patient's last menstrual period was 12/17/2023 (exact date)., presents today to establish care as a new patient and to address  lower abdominal pain. Pain began at 38yo with start of menstrual cycle. Pain is constant, right sided pain is worse than left. The pain is worse during menstruation. Pain has increased in last 2-63m and she has also noted spotting between cycles in the last 2-50m. During menstrual cycle unable to have BM due to pain. She has a family history of endometriosis.  Tubal ligation for contraception. Hx seizures, HTN-recently started on amlodipine, was on lisinopril.  Period Cycle (Days): 28 Period Duration (Days): 5 Period Pattern: Regular Menstrual Flow: Heavy Menstrual Control: Tampon Menstrual Control Change Freq (Hours): 1-4 Dysmenorrhea: (!) Severe Dysmenorrhea Symptoms: Cramping, Other (Comment) (abdominal pain)  There are no active problems to display for this patient.   Past Surgical History:  Procedure Laterality Date   APPENDECTOMY     TUBAL LIGATION     WISDOM TOOTH EXTRACTION      History reviewed. No pertinent family history.  Social History   Socioeconomic History   Marital status: Single    Spouse name: Not on file   Number of children: Not on file   Years of education: Not on file   Highest education level: Not on file  Occupational History   Not on file  Tobacco Use   Smoking status: Every Day    Current packs/day: 1.00    Types: Cigarettes   Smokeless tobacco: Not on file  Substance and Sexual Activity   Alcohol use: No   Drug use: No   Sexual activity: Not on file  Other Topics Concern   Not on file  Social History Narrative   Not on file   Social Drivers of Health   Financial Resource Strain: Low Risk  (03/04/2023)   Received from [facility]   Overall Financial Resource Strain (CARDIA)    Difficulty of Paying Living Expenses: Not hard at all  Food Insecurity: No Food Insecurity (03/04/2023)   Received from [facility]   Hunger Vital Sign    Within the past 12 months, you worried that your food would run out before you got the money to buy more.: Never true    Within the past 12 months, the food you bought just didn't last and you didn't have money to get more.: Never true  Transportation Needs: No Transportation Needs (03/04/2023)   Received from [facility] - Transportation    In the past 12 months, has lack of transportation kept you from medical appointments or from getting medications?: No    Lack of Transportation (Non-Medical): No  Physical Activity: Not on file  Stress: Not on file  Social Connections: Not on file  Intimate Partner Violence: Not on file    Outpatient Medications Prior to Visit  Medication Sig Dispense Refill   amLODipine (NORVASC) 5 MG tablet Take 5 mg by mouth daily.     sertraline (ZOLOFT) 50 MG tablet Take 50 mg by mouth.     cyclobenzaprine  (FLEXERIL ) 10 MG tablet Take 1 tablet (10 mg total) by mouth 3 (three) times daily as needed for muscle spasms. 12 tablet 0   etodolac  (  LODINE ) 200 MG capsule Take 1 capsule (200 mg total) by mouth every 8 (eight) hours. 12 capsule 0   predniSONE  (DELTASONE ) 20 MG tablet Take 2 tablets (40 mg total) by mouth daily. 10 tablet 0   No facility-administered medications prior to visit.      ROS:  Review of Systems  Constitutional:  Negative for appetite change and unexpected weight change.  Cardiovascular: Negative.   Gastrointestinal: Negative.   Endocrine: Negative for cold intolerance and heat intolerance.  Genitourinary:  Positive for dyspareunia, menstrual problem and pelvic pain.     OBJECTIVE:   Vitals:  BP (!) 152/100   Pulse 95   Ht 5' 2 (1.575 m)   Wt 142 lb 8 oz (64.6 kg)   LMP  12/17/2023 (Exact Date)   BMI 26.06 kg/m   Physical Exam Vitals reviewed.  Constitutional:      General: She is not in acute distress.    Appearance: Normal appearance.  Cardiovascular:     Rate and Rhythm: Normal rate.  Pulmonary:     Effort: Pulmonary effort is normal.  Abdominal:     General: Abdomen is flat.     Palpations: Abdomen is soft.     Tenderness: There is abdominal tenderness in the right lower quadrant, suprapubic area and left lower quadrant. There is guarding. There is no rebound.  Genitourinary:    General: Normal vulva.     Vagina: Normal.     Cervix: No cervical motion tenderness, friability or lesion.     Uterus: Tender. Not enlarged.      Adnexa:        Right: Tenderness present.        Left: Tenderness present.   Neurological:     General: No focal deficit present.     Mental Status: She is alert and oriented to person, place, and time.  Psychiatric:        Mood and Affect: Mood normal.        Behavior: Behavior normal.     Results: No results found for this or any previous visit (from the past 24 hours).   Assessment/Plan: 1. Encounter to establish care (Primary)  2. Lower abdominal pain - POCT urinalysis dipstick  3. Screening for cervical cancer  4. Screen for sexually transmitted diseases - Cervicovaginal ancillary only  5. Encounter for screening for human papillomavirus (HPV)  6. Pelvic pain  7. Intermenstrual bleeding  Differential includes ovarian cysts, fibroids, endometriosis. Discussed ultrasound cannot diagnose endometriosis. Trial norethindrone to manage symptoms, ultrasound ordered. If no improvement on POP recommend referral to pelvic pain/uro-gyn for further assessment of endometriosis diagnosis. Follow up with PCP at [facility] for HTN.   Meds ordered this encounter  Medications   norethindrone (MICRONOR) 0.35 MG tablet    Sig: Take 1 tablet (0.35 mg total) by mouth daily.    Dispense:  84 tablet    Refill:  4      RAFAEL QUESADA, CNM 01/04/2024 12:00 PM

## 2024-01-04 ENCOUNTER — Other Ambulatory Visit: Payer: Self-pay | Admitting: Certified Nurse Midwife

## 2024-01-04 ENCOUNTER — Ambulatory Visit (INDEPENDENT_AMBULATORY_CARE_PROVIDER_SITE_OTHER)

## 2024-01-04 ENCOUNTER — Encounter: Payer: Self-pay | Admitting: Certified Nurse Midwife

## 2024-01-04 ENCOUNTER — Other Ambulatory Visit (HOSPITAL_COMMUNITY)
Admission: RE | Admit: 2024-01-04 | Discharge: 2024-01-04 | Disposition: A | Discharge status: Home / Self Care | Source: Ambulatory Visit | Attending: Certified Nurse Midwife | Admitting: Certified Nurse Midwife

## 2024-01-04 ENCOUNTER — Ambulatory Visit: Admitting: Certified Nurse Midwife

## 2024-01-04 VITALS — BP 152/100 | HR 95 | Ht 62.0 in | Wt 142.5 lb

## 2024-01-04 DIAGNOSIS — Z113 Encounter for screening for infections with a predominantly sexual mode of transmission: Secondary | ICD-10-CM

## 2024-01-04 DIAGNOSIS — N923 Ovulation bleeding: Secondary | ICD-10-CM

## 2024-01-04 DIAGNOSIS — R102 Pelvic and perineal pain unspecified side: Secondary | ICD-10-CM | POA: Diagnosis not present

## 2024-01-04 DIAGNOSIS — Z124 Encounter for screening for malignant neoplasm of cervix: Secondary | ICD-10-CM

## 2024-01-04 DIAGNOSIS — Z1151 Encounter for screening for human papillomavirus (HPV): Secondary | ICD-10-CM

## 2024-01-04 DIAGNOSIS — R1030 Lower abdominal pain, unspecified: Secondary | ICD-10-CM | POA: Diagnosis not present

## 2024-01-04 DIAGNOSIS — Z7689 Persons encountering health services in other specified circumstances: Secondary | ICD-10-CM

## 2024-01-04 MED ORDER — NORETHINDRONE 0.35 MG PO TABS: 1.0000 | ORAL_TABLET | Freq: Every day | ORAL | 4 refills | Status: AC

## 2024-01-05 ENCOUNTER — Ambulatory Visit: Payer: Self-pay | Admitting: Certified Nurse Midwife

## 2024-01-05 LAB — CERVICOVAGINAL ANCILLARY ONLY
Bacterial Vaginitis (gardnerella): POSITIVE — AB
Candida Glabrata: POSITIVE — AB
Candida Vaginitis: POSITIVE — AB
Chlamydia: NEGATIVE
Comment: NEGATIVE
Comment: NEGATIVE
Comment: NEGATIVE
Comment: NEGATIVE
Comment: NEGATIVE
Comment: NORMAL
Neisseria Gonorrhea: NEGATIVE
Trichomonas: NEGATIVE

## 2024-01-05 MED ORDER — FLUCONAZOLE 150 MG PO TABS: 150.0000 mg | ORAL_TABLET | ORAL | 0 refills | Status: AC

## 2024-01-05 MED ORDER — METRONIDAZOLE 500 MG PO TABS: 500.0000 mg | ORAL_TABLET | Freq: Two times a day (BID) | ORAL | 0 refills | Status: AC

## 2024-01-29 ENCOUNTER — Ambulatory Visit: Payer: Self-pay | Admitting: Certified Nurse Midwife
